# Patient Record
Sex: Female | Born: 1945
Health system: Southern US, Academic
[De-identification: ages and names within clinical notes are randomized; demographics above are authoritative.]

## PROBLEM LIST (undated history)

## (undated) ENCOUNTER — Encounter

## (undated) DIAGNOSIS — T753XXA Motion sickness, initial encounter: Secondary | ICD-10-CM

## (undated) DIAGNOSIS — H9190 Unspecified hearing loss, unspecified ear: Secondary | ICD-10-CM

## (undated) DIAGNOSIS — I10 Essential (primary) hypertension: Secondary | ICD-10-CM

## (undated) DIAGNOSIS — M1990 Unspecified osteoarthritis, unspecified site: Secondary | ICD-10-CM

## (undated) DIAGNOSIS — G43909 Migraine, unspecified, not intractable, without status migrainosus: Secondary | ICD-10-CM

## (undated) DIAGNOSIS — E079 Disorder of thyroid, unspecified: Secondary | ICD-10-CM

## (undated) HISTORY — PX: ABDOMINAL HYSTERECTOMY: SHX81

## (undated) HISTORY — PX: BREAST CYST ASPIRATION: SHX578

## (undated) HISTORY — PX: APPENDECTOMY: SHX54

## (undated) HISTORY — PX: CHOLECYSTECTOMY: SHX55

## (undated) HISTORY — PX: THYROIDECTOMY, PARTIAL: SHX18

---

## 2015-09-20 DIAGNOSIS — A419 Sepsis, unspecified organism: Secondary | ICD-10-CM

## 2015-09-20 DIAGNOSIS — Z23 Encounter for immunization: Secondary | ICD-10-CM

## 2015-09-20 DIAGNOSIS — L03113 Cellulitis of right upper limb: Secondary | ICD-10-CM

## 2015-09-20 DIAGNOSIS — Z91013 Allergy to seafood: Secondary | ICD-10-CM

## 2015-09-20 DIAGNOSIS — Z882 Allergy status to sulfonamides status: Secondary | ICD-10-CM

## 2015-09-20 DIAGNOSIS — S61451A Open bite of right hand, initial encounter: Secondary | ICD-10-CM

## 2015-09-20 DIAGNOSIS — M797 Fibromyalgia: Secondary | ICD-10-CM

## 2015-09-20 DIAGNOSIS — L03119 Cellulitis of unspecified part of limb: Principal | ICD-10-CM

## 2015-09-20 DIAGNOSIS — M1990 Unspecified osteoarthritis, unspecified site: Secondary | ICD-10-CM

## 2015-09-20 DIAGNOSIS — W5501XA Bitten by cat, initial encounter: Secondary | ICD-10-CM

## 2015-09-20 MED ORDER — AMOXICILLIN-POT CLAVULANATE 875-125 MG PO TABS
1 | ORAL_TABLET | Freq: Once | ORAL | Status: CP
Start: 2015-09-20 — End: ?

## 2015-09-20 MED ORDER — RABIES VIRUS VACCINE, HDC IM INJ
1 mL | Freq: Once | INTRAMUSCULAR | Status: CP
Start: 2015-09-20 — End: ?

## 2015-09-20 MED ORDER — TETANUS-DIPHTHERIA TOXOIDS TD 2-2 LF/0.5ML IM SUSP
.5 mL | Freq: Once | INTRAMUSCULAR | Status: CP
Start: 2015-09-20 — End: ?

## 2015-09-20 MED ORDER — RABIES IMMUNE GLOBULIN 150 UNIT/ML IM INJ
20 [IU]/kg | Freq: Once | Status: CP
Start: 2015-09-20 — End: ?

## 2015-09-21 ENCOUNTER — Inpatient Hospital Stay
Admit: 2015-09-21 | Discharge: 2015-09-23 | Disposition: A | Discharge status: Home / Self Care | Admitting: Internal Medicine

## 2015-09-21 ENCOUNTER — Emergency Department: Admit: 2015-09-21 | Discharge: 2015-09-23

## 2015-09-21 ENCOUNTER — Inpatient Hospital Stay: Admit: 2015-09-21 | Discharge: 2015-09-23

## 2015-09-21 DIAGNOSIS — Z882 Allergy status to sulfonamides status: Secondary | ICD-10-CM

## 2015-09-21 DIAGNOSIS — Z23 Encounter for immunization: Secondary | ICD-10-CM

## 2015-09-21 DIAGNOSIS — M797 Fibromyalgia: Principal | ICD-10-CM

## 2015-09-21 DIAGNOSIS — Z91013 Allergy to seafood: Secondary | ICD-10-CM

## 2015-09-21 DIAGNOSIS — L03119 Cellulitis of unspecified part of limb: Principal | ICD-10-CM

## 2015-09-21 DIAGNOSIS — A419 Sepsis, unspecified organism: Secondary | ICD-10-CM

## 2015-09-21 DIAGNOSIS — S61451A Open bite of right hand, initial encounter: Secondary | ICD-10-CM

## 2015-09-21 DIAGNOSIS — M1990 Unspecified osteoarthritis, unspecified site: Secondary | ICD-10-CM

## 2015-09-21 DIAGNOSIS — L03113 Cellulitis of right upper limb: Secondary | ICD-10-CM

## 2015-09-21 MED ORDER — ONDANSETRON 4 MG PO TBDP
4 mg | Freq: Four times a day (QID) | ORAL | Status: DC | PRN
Start: 2015-09-21 — End: 2015-09-23

## 2015-09-21 MED ORDER — METRONIDAZOLE IN NACL 5-0.79 MG/ML-% IV SOLN
500 mg | Freq: Three times a day (TID) | INTRAVENOUS | Status: DC
Start: 2015-09-21 — End: 2015-09-23

## 2015-09-21 MED ORDER — DEXTROSE 50 % IV SOLN
30 mL | INTRAVENOUS | Status: DC | PRN
Start: 2015-09-21 — End: 2015-09-23

## 2015-09-21 MED ORDER — HEPARIN SODIUM (PORCINE) 5000 UNIT/ML IJ SOLN
5000 [IU] | Freq: Two times a day (BID) | SUBCUTANEOUS | Status: DC
Start: 2015-09-21 — End: 2015-09-23

## 2015-09-21 MED ORDER — ONDANSETRON HCL 4 MG/2ML IJ SOLN
4 mg | Freq: Four times a day (QID) | INTRAVENOUS | Status: DC | PRN
Start: 2015-09-21 — End: 2015-09-23

## 2015-09-21 MED ORDER — ACETAMINOPHEN 325 MG PO TABS
650 mg | Freq: Four times a day (QID) | ORAL | Status: DC | PRN
Start: 2015-09-21 — End: 2015-09-23

## 2015-09-21 MED ORDER — HYDRALAZINE HCL 20 MG/ML IJ SOLN
10 mg | Freq: Four times a day (QID) | INTRAVENOUS | Status: DC | PRN
Start: 2015-09-21 — End: 2015-09-23

## 2015-09-21 MED ORDER — SENNOSIDES-DOCUSATE SODIUM 8.6-50 MG PO TABS
1 | ORAL_TABLET | Freq: Two times a day (BID) | ORAL | Status: DC | PRN
Start: 2015-09-21 — End: 2015-09-23

## 2015-09-21 MED ORDER — AMOXICILLIN-POT CLAVULANATE 875-125 MG PO TABS
1 | ORAL_TABLET | Freq: Two times a day (BID) | ORAL | 0 refills | Status: CP
Start: 2015-09-21 — End: ?

## 2015-09-21 MED ORDER — IPRATROPIUM-ALBUTEROL 0.5-2.5 (3) MG/3ML IN SOLN
3 mL | RESPIRATORY_TRACT | Status: DC | PRN
Start: 2015-09-21 — End: 2015-09-23

## 2015-09-21 MED ORDER — MORPHINE SULFATE 2 MG/ML IV SOLN CUSTOM JX
2 mg | Freq: Four times a day (QID) | INTRAVENOUS | Status: DC | PRN
Start: 2015-09-21 — End: 2015-09-23

## 2015-09-21 MED ORDER — OXYCODONE-ACETAMINOPHEN 10-325 MG PO TABS
10 mg | Freq: Four times a day (QID) | ORAL | Status: DC | PRN
Start: 2015-09-21 — End: 2015-09-22

## 2015-09-21 MED ORDER — AMPICILLIN-SULBACTAM 3 G IV MBP
3 g | Freq: Four times a day (QID) | INTRAVENOUS | Status: DC
Start: 2015-09-21 — End: 2015-09-23

## 2015-09-21 MED ORDER — AMOXICILLIN-POT CLAVULANATE 875-125 MG PO TABS
1 | ORAL_TABLET | Freq: Two times a day (BID) | ORAL | Status: DC
Start: 2015-09-21 — End: 2015-09-21

## 2015-09-21 MED ORDER — GLUCOSE 4 G PO CHEW JX
16 g | ORAL | Status: DC | PRN
Start: 2015-09-21 — End: 2015-09-23

## 2015-09-21 MED ORDER — ALUM & MAG HYDROX-SIMETH 1200-1200-120 MG/30ML PO SUSP UD
30 mL | Freq: Four times a day (QID) | ORAL | Status: DC | PRN
Start: 2015-09-21 — End: 2015-09-23

## 2015-09-21 NOTE — Consults
Patient is admitted.

## 2015-09-21 NOTE — ED Notes
Time of discharge: 0104  AM., Patient discharged to  Home.  Patient discharged  ambulatory. to exit with belongings in  Stable condition.  Patient escorted by  family., Written discharge instructions given to  patient.  Patient/recipient  verbalizes discharge instructions.

## 2015-09-21 NOTE — Progress Notes
09/21/15 1500   Readmission Interview   Index DC Date 09/20/15   Readmit Date 09/21/15   How do you think you became sick enough to come back to the hospital? hand bite infection in arm   Did you see your doctor or the doctor's nurse in the office before you came back to the hospital?  No  (Spoke with my physician over the phone, advised me to come )   If no, why not? Spoke with the physician over the phone, he advised me to come to the ED   Describe any difficulties you had in getting a doctor's appointment or getting to that office visit. No   Has anything gotten in the way of your taking your medicines as instructed after your last hospital discharge? No   How do you take your medicines and set up your pills each day? No assistance needed   Additional information? No

## 2015-09-21 NOTE — Plan of Care
Problem: Discharge Planning  Goal: Safe effective discharge  Outcome: Met/Completed Date Met:  09/21/15  CM met with the pt to review discharge planning, pt may need [place] for IV antibiotics upon discharge. CM to follow for discharge planning needs at this time. Pt has transportation home upon discharge.

## 2015-09-21 NOTE — Progress Notes
Seen for admission. Alert and oriented. Respirations are even and unlabored. Denies complaints. No distress noted. Cat bite to right hand with redness extending up right arm to elbow.

## 2015-09-21 NOTE — ED Notes
Pt to rm 4, c/o redness and swelling to right arm after cat bite yesteday. Pt states she had treatment for rabies and tentus yesterday and started on oral antibiotics. AMRN

## 2015-09-21 NOTE — Progress Notes
time. Pt understands by law her HCS is her husband.   Health Care Proxy: Husband Sharnese Heath [phone]  Therapy Recommendations: No PT/OT assessment to determine discharge needs at this time   [place], Durable Medical Equipment: No hx of [place] or DME   Community Resources: Not applicable   Outpatient Case Manager: Not applicable   Primary Care Physician: Dalene Carrow, MD   Pharmacy:   Social History:   Social History     Social History   ? Marital status: Married     Spouse name: N/A   ? Number of children: N/A   ? Years of education: N/A     Occupational History   ? Not on file.     Social History Main Topics   ? Smoking status: Never Smoker   ? Smokeless tobacco: Not on file   ? Alcohol use Not on file   ? Drug use: Not on file   ? Sexual activity: Not on file     Other Topics Concern   ? Not on file     Social History Narrative     Assessment and Discharge Plan:  Additional Information: Met with patient. Introduced self and explained CM role in safe discharge planning. Pt is AOX4 and presents with an approrpriate affect. Pt has no hx of HHC, STR, SNF LTC, ALF, or DME. Pt has access to fill her Rx and transportation home upon discharge.  Pt follows with [facility] for PCP. Discharge planning assessment completed.  Discharge Plan: Home vs Home with [facility], if wound care and IV antibiotics are needed   Discharge Pending: cellulitis of right hand and arm   Expected Discharge Date: Expected discharge date: 09/25/15  Transportation/Contact Person at Discharge: Husband Ashlley Booher [phone]  Manfred Arch   09/21/2015 3:16 PM

## 2015-09-21 NOTE — Progress Notes
09/21/15 1500   Readmission Interview   Index DC Date 09/20/15   Readmit Date 09/21/15   How do you think you became sick enough to come back to the hospital? hand bite infection in arm   Did you see your doctor or the doctor's nurse in the office before you came back to the hospital?  No  (Spoke with my physician over the phone, advised me to come )   If no, why not? Spoke with the physician over the phone, he advised me to come to the ED   Describe any difficulties you had in getting a doctor's appointment or getting to that office visit. No   Has anything gotten in the way of your taking your medicines as instructed after your last hospital discharge? No   How do you take your medicines and set up your pills each day? No assistance needed   Additional information? No      Department of Case Management  Discharge Planning     Patient ID:   NAME:  Katie Haney  MRN: [id number]    AGE: 70 y.o.   DOB: 03/13/1946  Date of Admission: 09/21/2015      Address: [address]  [place] [id number]     Emergency Contact: Husband Barbette OrJames Shifrin [phone]  Payor: Payor: MEDICARE HUMANA / Plan: MEDICARE HUMANA GOLD PLUS HMO / Product Type: HMO/POS /     Medicare Important Message Provided: Needs IM statement  Patient admitted with There is no problem list on file for this patient.      Patient has Capacity for Decision-Making: Yes  Prior Living Arrangements / Resides with: Pt resides with her husband in a single story home.   Patient Capacity for Self-Care / Level of Independence: Pt is independent with her ADLs.   Caregiver / Support System: Husband Barbette OrJames Williams [phone]  Patient provided contact person: Husband Barbette OrJames Debow [phone]    Name: Husband Barbette OrJames Heap [phone]  Relationship: Husband Barbette OrJames Mccaster [phone]  Phone #: Husband Barbette OrJames Reznik [phone]  Advance Directive: Educated on HCS, pt declined to complete them at this

## 2015-09-21 NOTE — ED Notes
Patient arrived to ED, acocmpanied by husband with chief complaint of recent cat bite to lateral right hand. 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 Small punture marks noted to the anterior and posterior portion of her hand. REdness and swelling noted directly on puncture sites but it appears to be radiating down hand. Patient unknown when last Tetanus shot was done. Patient blood pressure slightly elevated, remaining vitals stable, breathing unlabored.

## 2015-09-21 NOTE — ED Notes
Report called to Maya RN. AMRN

## 2015-09-21 NOTE — ED Notes
Rabies Immune Globulin infiltrated into area surrounding punctures by Panfila, RN. Pt tolerated procedure well.

## 2015-09-22 MED ORDER — IBUPROFEN 800 MG PO TABS
800 mg | Freq: Four times a day (QID) | ORAL | Status: DC | PRN
Start: 2015-09-22 — End: 2015-09-23

## 2015-09-22 NOTE — Plan of Care
Problem: Pain, Acute & Chronic  Goal: Pain is relieved/acceptable level with minimal side effects  Outcome: Ongoing  Denied pain or other discomfort will cont to assess.    Problem: Knowledge Deficit  Goal: Understands DX, medications & necessary therapeutic regimen  Outcome: Ongoing  Patient educated on POC, med's, side effects, nursing interventions, verbalize understanding.

## 2015-09-22 NOTE — ED Provider Notes
?   THYROIDECTOMY, PARTIAL         History reviewed. No pertinent family history.    Social History     Social History   ? Marital status: Married     Spouse name: N/A   ? Number of children: N/A   ? Years of education: N/A     Social History Main Topics   ? Smoking status: Never Smoker   ? Smokeless tobacco: None   ? Alcohol use None   ? Drug use: None   ? Sexual activity: Not Asked     Other Topics Concern   ? None     Social History Narrative       Review of Systems   Constitutional: Positive for fever (subjective). Negative for chills.   HENT: Negative for nasal congestion and sore throat.    Respiratory: Negative for cough and shortness of breath.    Cardiovascular: Negative for chest pain.   Gastrointestinal: Negative for nausea, vomiting and abdominal pain.   Genitourinary: Negative for dysuria and frequency.   Musculoskeletal: Negative for joint swelling and arthralgias.   Skin: Positive for wound (cat bite to right arm).   Neurological: Negative for weakness and numbness.   All other systems reviewed and are negative.      Physical Exam       ED Triage Vitals   BP 09/21/15 1308 168/67   Pulse 09/21/15 1308 79   Resp 09/21/15 1308 19   Temp 09/21/15 1308 36.9 ?C (98.4 ?F)   Temp src 09/21/15 1308 Oral   Height 09/21/15 1308 1.585 m   Weight 09/21/15 1308 64.6 kg   SpO2 09/21/15 1308 100 %   BMI (Calculated) 09/21/15 1308 25.77       BP 168/67 - Pulse 79 - Temp 36.9 ?C (98.4 ?F) (Oral)  - Resp 19 - Ht 1.585 m - Wt 64.6 kg - SpO2 100% - BMI 25.71 kg/m2    Physical Exam   Constitutional: She is oriented to person, place, and time. She appears well-developed and well-nourished. No distress.   HENT:   Head: Normocephalic and atraumatic.   Mouth/Throat: Oropharynx is clear and moist.   Eyes: Conjunctivae are normal. Right eye exhibits no discharge. Left eye exhibits no discharge.   Neck: Normal range of motion. Neck supple.   Cardiovascular: Normal rate, regular rhythm, normal heart sounds and

## 2015-09-22 NOTE — ED Provider Notes
Abx vaccine Ig TT updated  Adm for Iv abx  Closing Discussion done with Patient which includes Review of all Results and Explnation of the POA which patient/ parents/ partners have understood      ED Disposition   ED Disposition: Admit      ED Clinical Impression   ED Clinical Impression:   Cellulitis of right upper extremity      ED Patient Status   Patient Status:   Fair        ED Medical Evaluation Initiated   Medical Evaluation Initiated:   Yes, filed at 09/21/15 1303  by Jolene SchimkeGalwankar, Sagar C, MD          Scribe Attestation: I, Jeffie PollockMikel Cress, have acted as a Neurosurgeonscribe for Jolene SchimkeGalwankar, Sagar C, MD 1:34 PM 09/21/2015     Physician Attestation: I have reviewed and confirmed the information stated by the scribe and made corrections and edits as appropriate.  I have personally provided the services documented by the scribe.      Jolene SchimkeSagar C Galwankar, MD        Jolene SchimkeGalwankar, Sagar C, MD  09/21/15 [id number]

## 2015-09-22 NOTE — ED Provider Notes
Worsening Cellulitis RUE with distal N/V Intact  Failed OP Rx : worsening clinically and leucocytosis  Abx vaccine Ig TT updated  Adm for Iv abx  Closing Discussion done with Patient which includes Review of all Results and Explnation of the POA which patient/ parents/ partners have understood      ED Disposition   ED Disposition: Admit      ED Clinical Impression   ED Clinical Impression:   Cellulitis of right upper extremity      ED Patient Status   Patient Status:   Good        ED Medical Evaluation Initiated   Medical Evaluation Initiated:   Yes, filed at 09/21/15 1303  by Jolene SchimkeGalwankar, Sagar C, MD          Scribe Attestation: I, Jeffie PollockMikel Cress, have acted as a Neurosurgeonscribe for Jolene SchimkeGalwankar, Sagar C, MD 1:34 PM 09/21/2015     Physician Attestation: I have reviewed and confirmed the information stated by the scribe and made corrections and edits as appropriate.  I have personally provided the services documented by the scribe.      Jolene SchimkeSagar C Galwankar, MD

## 2015-09-22 NOTE — ED Provider Notes
Imaging (Read by ED Provider):  not applicable    EKG (Read by ED Provider):  not applicable    MDM  Number of Diagnoses or Management Options     Amount and/or Complexity of Data Reviewed  Discussion of test results with the performing providers: no  Decide to obtain previous medical records or to obtain history from someone other than the patient: yes  Obtain history from someone other than the patient: no  Review and summarize past medical records: yes  Discuss the patient with other providers: yes  Independent visualization of images, tracings, or specimens: no        ED Course & Re-Evaluation   70 y.o. female presents with c/o cellulitis to right arm s/p bite from stray cat bite last night. Rabies vaccinations administered and tetanus was updated at that time. Amoxicillin given. However, notes worsening erythema to site upon waking this AM. CBC, BMP, and blood cultures ordered. Plan to admit.  Jolene SchimkeGalwankar, Sagar C, MD 1:34 PM 09/21/2015      ***    ED Disposition   ED Disposition: No ED Disposition Set      ED Clinical Impression   ED Clinical Impression:   No Clinical Impression Set      ED Patient Status   Patient Status:   {SH ED JX PATIENT STATUS:[phone]}        ED Medical Evaluation Initiated   Medical Evaluation Initiated:   Yes, filed at 09/21/15 1303  by Jolene SchimkeGalwankar, Sagar C, MD          Scribe Attestation: I, Jeffie PollockMikel Cress, have acted as a Neurosurgeonscribe for Jolene SchimkeGalwankar, Sagar C, MD 1:34 PM 09/21/2015     Physician Attestation: Marland Kitchen***

## 2015-09-22 NOTE — ED Provider Notes
History     Chief Complaint   Patient presents with   ? Wound Infection       HPI Comments: 70 y.o. female presents with c/o cellulitis to right arm s/p cat bite last night. Patient was seen here in the ED after she was bit by a stray cat. Rabies vaccinations administered and tetanus was updated at that time. Amoxicillin given; d/c home with Rx. However, patient awoke this morning with area of erythema surrounding bite that has been progressively worsening. Denies any discharge from site. Notes subjective fever - no N/V, chills, numbness, weakness, or any other sxs. Patient has no other complaints at this time.      Patient is a 70 y.o. female presenting with Cellulitis. The history is provided by the patient and medical records. No language interpreter was used.   Cellulitis    This is a new problem. The current episode started today. The problem occurs continuously. The problem has been gradually worsening. The abscess is present on the right arm. The problem is moderate. The abscess is characterized by redness and swelling. Associated with: cat bite. The abscess first occurred outside. Associated symptoms include a fever (subjective). Pertinent negatives include no vomiting, no congestion, no sore throat and no cough. Recently, medical care has been given at this facility. Services received include medications given.       Allergies   Allergen Reactions   ? Shellfish Allergy Hives   ? Sulfa Drugs Hives       Current Discharge Medication List      CONTINUE these medications which have NOT CHANGED    Details   amoxicillin-clavulanate (AUGMENTIN) 875-125 MG Tablet Take 1 tablet by mouth 2 times daily for 10 days.  Qty: 20 tablet, Refills: 0    Associated Diagnoses: Cat bite of hand, right, initial encounter             Past Medical History:   Diagnosis Date   ? Arthritis    ? Fibromyalgia        Past Surgical History:   Procedure Laterality Date   ? APPENDECTOMY     ? C-SECTION DELIVERY     ? CHOLECYSTECTOMY

## 2015-09-22 NOTE — ED Provider Notes
RBC 3.80 (*) 4.20 - 5.40 x10E6/uL    Hemoglobin 11.9 (*) 12.0 - 16.0 g/dL    Hematocrit 16.134.4 (*) 37.0 - 47.0 %    RDW 11.9 (*) 12.0 - 16.1 %    Neutrophils % 88.8 (*) 34.0 - 73.0 %    Lymphocytes % 5.5 (*) 25.0 - 45.0 %    Eosinophils % 0.1 (*) 1.0 - 4.0 %    Neutrophils Absolute 17.16 (*) 1.80 - 8.70 x10E3/uL    MCV 90.5  82.0 - 101.0 fl    MCH 31.3  27.0 - 34.0 pg    MCHC 34.6  31.0 - 36.0 g/dL    Platelet Count 096158  140 - 440 thou/cu mm    MPV 11.1  9.5 - 11.5 fl    nRBC % 0.0  0.0 - 1.0 %    Absolute NRBC Count 0.00      Monocytes % 4.7  2.0 - 6.0 %    Immature Granulocytes % 0.7  0.0 - 2.0 %    Lymphocytes Absolute 1.06  x10E3/uL    Monocytes Absolute 0.91  x10E3/uL    Eosinophils Absolute 0.01  x10E3/uL    Basophil Absolute 0.03  x10E3/uL    Basophils % 0.2  0 - 1 %   CULTURE, BLOOD   CULTURE, BLOOD   CBC AND DIFFERENTIAL         Imaging (Read by ED Provider):  not applicable    EKG (Read by ED Provider):  not applicable    MDM  Number of Diagnoses or Management Options     Amount and/or Complexity of Data Reviewed  Discussion of test results with the performing providers: no  Decide to obtain previous medical records or to obtain history from someone other than the patient: yes  Obtain history from someone other than the patient: no  Review and summarize past medical records: yes  Discuss the patient with other providers: yes  Independent visualization of images, tracings, or specimens: no        ED Course & Re-Evaluation   70 y.o. female presents with c/o cellulitis to right arm s/p bite from stray cat bite last night. Rabies vaccinations administered and tetanus was updated at that time. Amoxicillin given. However, notes worsening erythema to site upon waking this AM. CBC, BMP, and blood cultures ordered. Plan to admit.  Jolene SchimkeGalwankar, Sagar C, MD 1:34 PM 09/21/2015      Jolene SchimkeSagar C Galwankar, MD 1:56 PM 09/21/2015  Worsening Cellulitis RUE with distal N/V Intact  Failed OP Rx : worsening clinically and leucocytosis

## 2015-09-22 NOTE — ED Provider Notes
Worsening Cellulitis RUE with distal N/V Intact  Failed OP Rx : worsening clinically and leucocytosis  Abx vaccine Ig TT updated  Adm for Iv abx  Closing Discussion done with Patient which includes Review of all Results and Explnation of the POA which patient/ parents/ partners have understood      ED Disposition   ED Disposition: Admit      ED Clinical Impression   ED Clinical Impression:   Cellulitis of right upper extremity      ED Patient Status   Patient Status:   Fair        ED Medical Evaluation Initiated   Medical Evaluation Initiated:   Yes, filed at 09/21/15 1303  by Jolene SchimkeGalwankar, Sagar C, MD          Scribe Attestation: I, Jeffie PollockMikel Cress, have acted as a Neurosurgeonscribe for Jolene SchimkeGalwankar, Sagar C, MD 1:34 PM 09/21/2015     Physician Attestation: I have reviewed and confirmed the information stated by the scribe and made corrections and edits as appropriate.  I have personally provided the services documented by the scribe.      Jolene SchimkeSagar C Galwankar, MD

## 2015-09-22 NOTE — Progress Notes
Visited with pt. PCP.

## 2015-09-22 NOTE — H&P
Monocytes Absolute 0.91    Eosinophils Absolute 0.01    Basophil Absolute 0.03    Basophils % 0.2   Culture, Blood X 2 - Set 1 of 2    Collection Time: 09/21/15  1:45 PM   Result Value    Culture Result No growth at less than 24 hours incubation    Narrative    Blood culture received with excess volume of blood.  Excess blood culture volumes delay positivity and may lead to false negative results.       Per Radiology: Xr Hand Right 3 Views    Result Date: 09/21/2015  XR HAND RIGHT 3 VIEWS Clinical indication:70 years Female Cat bite Comparison:  None Findings: The osseous structures are intact. No dislocations are visualized. Joint spaces are preserved. No soft tissue calcifications or radiopaque foreign bodies. There is diffuse soft tissue swelling visualized of the medial aspect of the hand adjacent to the fifth metacarpal bone and tracking to the medial aspect of the wrist and adjacent to the ulnar styloid. No evidence of subcutaneous gas.     Impression: Diffuse soft tissue swelling noted of the medial aspect of the hand adjacent to the fifth digit  and tracking to the medial aspect of the wrist without evidence of osseous involvement. These findings can be seen in the setting of infectious/inflammatory process such as cellulitis. I personally reviewed the images and the residents findings and agree with the above. Read By Clarita Crane- Martha Wasserman M.D.  Electronically Verified By - Clarita CraneMartha Wasserman M.D.  Released Date Time - 09/21/2015 12:27 PM  Resident - Micheline RoughMarsela Hyska         Assessment and Plan:     # SIRS/sepsis on admission due to acute cellulitis of right hand extending above elbow after cat bite to her right hand last night. Failed oral antibiotics. Will provide IV Unasyn and flagyl. Will provide adequate pain control.updated tetanus toxoid and rabies. Will closely monitor for compartment syndrome. Will check blood culture if she has fever>100.5 F    # DVT prophylaxis: on s/c heparin    # Code status: Full code

## 2015-09-22 NOTE — ED Provider Notes
Pulmonary/Chest: Effort normal and breath sounds normal. No respiratory distress.   Abdominal: Soft. Bowel sounds are normal. She exhibits no distension. There is no tenderness. There is no rebound and no guarding.   Musculoskeletal: Normal range of motion. She exhibits no tenderness.   Neurological: She is alert and oriented to person, place, and time. No cranial nerve deficit. She exhibits normal muscle tone. Coordination normal.   Skin: Skin is warm and dry. She is not diaphoretic. There is erythema.        Area of erythema to right forearm. Warm to the touch. No tenderness.  No signs of cord compression or compartment syndrome. Distal NV intact.     Psychiatric: She has a normal mood and affect. Her behavior is normal. Judgment and thought content normal.   Nursing note and vitals reviewed.      Differential DDx: animal bite, cellulitis, rabies, other    Is this an Emergent Medical Condition? Yes - Severe Pain/Acute Onset of Symptons  409.901 FS  641.19 FS  627.732 (16) FS    ED Workup   Procedures    Labs:  -   COMPREHENSIVE METABOLIC PANEL - Abnormal        Result Value Ref Range    Chloride 100 (*) 101 - 110 mmol/L    Glucose 118 (*) 71 - 99 mg/dL    Sodium 136  135 - 145 mmol/L    Potassium 4.0  3.3 - 4.6 mmol/L    CO2 23  21 - 29 mmol/L    Urea Nitrogen 12  6 - 22 mg/dL    Creatinine 0.63  0.51 - 0.95 mg/dL    BUN/Creatinine Ratio 19.0  6.0 - 22.0 (calc)    Calcium 8.7  8.6 - 10.0 mg/dL    Total Protein 6.6  6.5 - 8.3 g/dL    Albumin 4.1  3.8 - 4.9 g/dL    Calc Total Globuin 2.5  gm/dL    ALBUMIN/GLOBULIN RATIO 1.6  (calc)    Total Bilirubin 0.3  0.2 - 1.0 mg/dL    Alkaline Phosphatase 69  35 - 104 IU/L    AST 15  14 - 33 IU/L    ALT 12  10 - 42 IU/L    Osmolality Calc 272.8      Anion Gap 13  4 - 16 mmol/L    EGFR >59  mL/min/1.73M2    Comment:   Reference range: =>90 ml/min/1.73M2  eGFR estimates are unable to accurately differentiate levels of GFR above 60 ml/min/1.73M2.   CBC AUTODIFF - Abnormal

## 2015-09-22 NOTE — Progress Notes
Anion Gap 13    EGFR >59   Magnesium    Collection Time: 09/22/15  5:23 AM   Result Value    Magnesium 2.2   Phosphorus    Collection Time: 09/22/15  5:23 AM   Result Value    Phosphorus,Inorganic 2.9   Prealbumin    Collection Time: 09/22/15  5:23 AM   Result Value    Prealbumin 19.0 (L)   CBC with Differential panel result    Collection Time: 09/22/15  5:23 AM   Result Value    WBC 12.74 (H)    RBC 3.74 (L)    Hemoglobin 11.5 (L)    Hematocrit 33.8 (L)    MCV 90.4    MCH 30.7    MCHC 34.0    RDW 12.0    Platelet Count 150    MPV 12.0 (H)    nRBC % 0.0    Absolute NRBC Count 0.00    Neutrophils % 82.5 (H)    Lymphocytes % 10.8 (L)    Monocytes % 6.0    Eosinophils % 0.3 (L)    Immature Granulocytes % 0.2    Neutrophils Absolute 10.50 (H)    Lymphocytes Absolute 1.38    Monocytes Absolute 0.76    Eosinophils Absolute 0.04    Basophil Absolute 0.03    Basophils % 0.2   Per Radiology: Xr Hand Right 3 Views    Result Date: 09/21/2015  XR HAND RIGHT 3 VIEWS Clinical indication:70 years Female Cat bite Comparison:  None Findings: The osseous structures are intact. No dislocations are visualized. Joint spaces are preserved. No soft tissue calcifications or radiopaque foreign bodies. There is diffuse soft tissue swelling visualized of the medial aspect of the hand adjacent to the fifth metacarpal bone and tracking to the medial aspect of the wrist and adjacent to the ulnar styloid. No evidence of subcutaneous gas.     Impression: Diffuse soft tissue swelling noted of the medial aspect of the hand adjacent to the fifth digit  and tracking to the medial aspect of the wrist without evidence of osseous involvement. These findings can be seen in the setting of infectious/inflammatory process such as cellulitis. I personally reviewed the images and the residents findings and agree with the above. Read By Oren Beckmann M.D.  Electronically Verified By - Oren Beckmann M.D.  Released Date Time - 09/21/2015 12:27 PM  Resident -

## 2015-09-22 NOTE — Progress Notes
Department of Hospital Medicine  Progress Note      Admit Date: 09/21/2015   LOS: 1 day     Subjective:     Patient is resting comfortably. Slightly improve pain ,redness and swelling of her right hand and arm. Denies fever/CP/SOB/dysuria/cough/abdominal pain. No other new complaint    Negative all other ROS    Current Hospital Medications:  Scheduled:   ? ampicillin-sulbactam  3 g Intravenous Q6H [facility]   ? heparin  5,000 Units Subcutaneous Q12H [facility]   ? metroNIDAZOLE  500 mg Intravenous Q8H SCH     Continuous Infusions:    PRN:   acetaminophen 650 mg Oral Q6H PRN   aluminum-magnesium-simethicone hydroxide 30 mL Oral Q6H PRN   dextrose 30 mL Intravenous PRN   glucose 16 g Oral PRN   hydrALAZINE 10 mg Intravenous Q6H PRN   ibuprofen 800 mg Oral Q6H PRN   ipratropium-albuterol 3 mL Nebulization Q4H PRN   morphine 2 mg Intravenous Q6H PRN   ondansetron 4 mg Oral Q6H PRN   Or      ondansetron 4 mg Intravenous Q6H PRN   senna-docusate 1 tablet Oral BID PRN       Objective:     Vital Signs: Last Filed Vitals Signs: 24 Hour Range   BP: 123/75 (07/03 1150) BP: (113-131)/(52-75)    Temp: 36.9 ?C (98.4 ?F) (07/03 1150) Temp:  [36.7 ?C (98 ?F)-37.6 ?C (99.7 ?F)]    Pulse: 71 (07/03 1150) Pulse:  [71-82]    Resp: 18 (07/03 1150) Resp:  [16-18]    SpO2: 97 % (07/03 1150) SpO2:  [96 %-100 %]          Pain Score  Pain Rating ([facility] Only) : 0  Pain Assessment  Pain Type: Acute pain  Pain Location: Head  Pain Descriptors: Headache    Date 09/21/15 0700 - 09/22/15 0659 09/22/15 0700 - 09/23/15 0659   Shift 0700-1459 1500-2259 2300-0659 24 Hour Total 0700-1459 1500-2259 2300-0659 24 Hour Total   I  N  T  A  K  E   P.O.     480   480      P.O.     480   480    Shift Total     480   480   O  U  T  P  U  T   Urine              Urine Occurrence     2 x   2 x    Shift Total           NET     480   480       Physical Exam  Constitutional: She is oriented to person, place, and time. She appears well-developed and well-nourished. No distress.

## 2015-09-22 NOTE — ED Provider Notes
intact distal pulses.    Pulmonary/Chest: Effort normal and breath sounds normal. No respiratory distress.   Abdominal: Soft. Bowel sounds are normal. She exhibits no distension. There is no tenderness. There is no rebound and no guarding.   Musculoskeletal: Normal range of motion. She exhibits no tenderness.   Neurological: She is alert and oriented to person, place, and time. No cranial nerve deficit. She exhibits normal muscle tone. Coordination normal.   Skin: Skin is warm and dry. She is not diaphoretic. There is erythema.        Area of erythema to right forearm. Warm to the touch. No tenderness.  No signs of cord compression or compartment syndrome. Distal NV intact.     Psychiatric: She has a normal mood and affect. Her behavior is normal. Judgment and thought content normal.   Nursing note and vitals reviewed.      Differential DDx: animal bite, cellulitis, rabies, other    Is this an Emergent Medical Condition? Yes - Severe Pain/Acute Onset of Symptons  409.901 FS  641.19 FS  627.732 (16) FS    ED Workup   Procedures    Labs:  -   COMPREHENSIVE METABOLIC PANEL - Abnormal        Result Value Ref Range    Chloride 100 (*) 101 - 110 mmol/L    Glucose 118 (*) 71 - 99 mg/dL    Sodium 136  135 - 145 mmol/L    Potassium 4.0  3.3 - 4.6 mmol/L    CO2 23  21 - 29 mmol/L    Urea Nitrogen 12  6 - 22 mg/dL    Creatinine 0.63  0.51 - 0.95 mg/dL    BUN/Creatinine Ratio 19.0  6.0 - 22.0 (calc)    Calcium 8.7  8.6 - 10.0 mg/dL    Total Protein 6.6  6.5 - 8.3 g/dL    Albumin 4.1  3.8 - 4.9 g/dL    Calc Total Globuin 2.5  gm/dL    ALBUMIN/GLOBULIN RATIO 1.6  (calc)    Total Bilirubin 0.3  0.2 - 1.0 mg/dL    Alkaline Phosphatase 69  35 - 104 IU/L    AST 15  14 - 33 IU/L    ALT 12  10 - 42 IU/L    Osmolality Calc 272.8      Anion Gap 13  4 - 16 mmol/L    EGFR >59  mL/min/1.73M2    Comment:   Reference range: =>90 ml/min/1.73M2  eGFR estimates are unable to accurately differentiate levels of GFR above 60 ml/min/1.73M2.

## 2015-09-22 NOTE — ED Provider Notes
ED Disposition   ED Disposition: No ED Disposition Set      ED Clinical Impression   ED Clinical Impression:   No Clinical Impression Set      ED Patient Status   Patient Status:   {SH ED JX PATIENT STATUS:[phone]}        ED Medical Evaluation Initiated   Medical Evaluation Initiated:   Yes, filed at 09/21/15 1303  by Jolene SchimkeGalwankar, Sagar C, MD          Scribe Attestation: I, Jeffie PollockMikel Cress, have acted as a Neurosurgeonscribe for Jolene SchimkeGalwankar, Sagar C, MD 1:34 PM 09/21/2015     Physician Attestation: Marland Kitchen***

## 2015-09-22 NOTE — Plan of Care
Educated pt how to use sonifi. Pt was able to order breakfast.

## 2015-09-22 NOTE — Progress Notes
Katie Haney          Assessment and Plan:     # SIRS/sepsis on admission due to acute cellulitis of right hand extending above elbow after cat bite: slightly improve pain,erythema and swelling today. Continue IV Unasyn and flagyl. Continue adequate pain control. Has updated tetanus toxoid and rabies. Improving leucocytosis. Negative blood culture so far.  ?  # DVT prophylaxis: on s/c heparin  ?  # Code status: Full code    # Disposition: anticipate D/C patient in 2-3 days if stable.   ?  Saw Po  09/22/2015  2:36 PM

## 2015-09-22 NOTE — Plan of Care
Problem: Knowledge Deficit  Goal: Understands DX, medications & necessary therapeutic regimen  Outcome: Ongoing  Educated pt on atx and reason for them. Pt understood with no questions.

## 2015-09-22 NOTE — H&P
Department of Hospital Medicine  Admission H&P      Admission Date and Time: 09/21/2015  1:02 PM     Subjective:     Chief Complaint:  Right hand and arm pain and swelling x 2 days after cat bite    History of Present Illness:  Gala RomneySonja Maser is a 70 y.o. female who is being admitted for evaluation of  right hand and arm pain and swelling after her right hand was bitten by astray cat  last night.  Rabies vaccinations administered and tetanus was updated at that time. Amoxicillin was given and  d/c home with oral antibiotics  However, patient woke up this morning with area of erythema surrounding bite that has been progressively extending above right elbow. .    Past Medical History:   Diagnosis Date   ? Fibromyalgia      Past Surgical History:   Procedure Laterality Date   ? APPENDECTOMY     ? C-SECTION DELIVERY     ? CHOLECYSTECTOMY     ? THYROIDECTOMY, PARTIAL       History reviewed. No pertinent family history. Denies any family h/o of DM, CAD,Stroke or cancer  Social History     Social History   ? Marital status: Married     Spouse name: N/A   ? Number of children: N/A   ? Years of education: N/A     Occupational History   ? Not on file.     Social History Main Topics   ? Smoking status: Never Smoker   ? Smokeless tobacco: Not on file   ? Alcohol use Not on file   ? Drug use: Not on file   ? Sexual activity: Not on file     Other Topics Concern   ? Not on file     Social History Narrative     I have reviewed the past medical, surgical, family and social history.    Home Medications:    (Not in a hospital admission)  Allergies:  Shellfish allergy and Sulfa drugs    Review of Systems   Constitutional: Negative for appetite change, chills and diaphoresis.   HENT: Negative for congestion, dental problem, drooling, hearing loss and mouth sores.    Eyes: Negative for discharge and itching.   Respiratory: Negative for apnea, choking and chest tightness.    Cardiovascular: Negative for chest pain and leg swelling.

## 2015-09-22 NOTE — Consults
Assessment: Patient is set up A with ADLs, limited by decreased FMC. Pt is independent with all mobility. Pt provided with built up foam for increased independence with self feed and HEP for RUE. Pt motivated to participate with therapy with excellent rehab potential. Pt would benefit from continued skilled OT services to maximize I with ADLs and return to PLOF.    Patient with deficits in:  ? ADLs, IADLs, strength, ROM and coordination    Discharge Recommendations:   ? Discharge Disposition: Home with intermittent caregiver supervision/assist and Outpatient  OT    ? DME Recommendations:  1. None    **Note: Discharge recommendations may change based on patient progress. Please refer to the most updated progress note for current discharge recommendations.     Plan of Care: Patient will be seen 4-5 times per week for ADLs, IADLs , therapeutic activities , therapeutic exercise  and patient/caregiver education.    G-Codes:  NA    ________________________  Bryon LionsMaria E Matyjasik-Liggett, OT  09/22/2015

## 2015-09-22 NOTE — Consults
fingers 1/2 composite flexion with full extension    Left Upper Extremity:  ? Within Functional Limits [facility](WFL) for AROM/PROM and strength    Vision History:   ? No significant visual history  Vision:  ? Vision WNL for visual fields, saccades, EOMs and acuity    Neurological Examination of the Upper Extremity:  ? Sensation: Intact  ? Proprioception: Intact   ? Coordination: Decreased FMC R hand, pt is RHD  ? Motor Control: Within Functional Limits  ? Tone: WFL    ADLs:  ? Feeding: Set up assist   ? Grooming: Set up assist  ? UB dressing: Set up assist  ? LB dressing: Set up assist  ? Bathing: Set up assist  ? Toileting: Set up assist    Functional Activities:  Bed Mobility:  ? Supine to Sit: Independence (I) with head of bed (HOB) elevated  ? Sit to Supine: Independence (I) with head of bed (HOB) elevated  Transfers:  ? Sit to/from Stand: Independent with No assistive device    Functional mobility: Pt mobile without AD independently    Balance:   ? Sitting: Static  Independence (I) and Dynamic  Independence (I)  ? Standing: Static  Independence (I) and Dynamic  Independence (I)    Outcome Measures:  ? AM-PAC "6-clicks" Short Form: Raw Score: 18. AM-PAC Score: 41.05. CMS Score: 40.47% - CK    Special Tests:  ? NA    Treatment on Evaluation:  Was additional treatment provided? Yes ADLs: Pt provided with built up foam for utensils for RUE, pt able to grip built up foam and feed self with MOD I, continues to require assist to open containers.  THERAPEUTIC EXERCISE: Pt provided with RUE HEP including AROM wrist and 6 pack hand exercises, copy placed in chart and copy given to pt. Pt demonstrated 5x1 each exercise. Pt also educated on using LUE to assist RUE in achieving full ROM.   Total Coded Minutes: 8    Post Treatment:   Patient Position/safety: Supine, Call Bell within reach, Tray table within reach, All lines and leads intact, Handoff to nurse on patient position

## 2015-09-22 NOTE — ED Provider Notes
CBC AUTODIFF - Abnormal     WBC 19.30 (*) 4.5 - 11 x10E3/uL    RBC 3.80 (*) 4.20 - 5.40 x10E6/uL    Hemoglobin 11.9 (*) 12.0 - 16.0 g/dL    Hematocrit 16.134.4 (*) 37.0 - 47.0 %    RDW 11.9 (*) 12.0 - 16.1 %    Neutrophils % 88.8 (*) 34.0 - 73.0 %    Lymphocytes % 5.5 (*) 25.0 - 45.0 %    Eosinophils % 0.1 (*) 1.0 - 4.0 %    Neutrophils Absolute 17.16 (*) 1.80 - 8.70 x10E3/uL    MCV 90.5  82.0 - 101.0 fl    MCH 31.3  27.0 - 34.0 pg    MCHC 34.6  31.0 - 36.0 g/dL    Platelet Count 096158  140 - 440 thou/cu mm    MPV 11.1  9.5 - 11.5 fl    nRBC % 0.0  0.0 - 1.0 %    Absolute NRBC Count 0.00      Monocytes % 4.7  2.0 - 6.0 %    Immature Granulocytes % 0.7  0.0 - 2.0 %    Lymphocytes Absolute 1.06  x10E3/uL    Monocytes Absolute 0.91  x10E3/uL    Eosinophils Absolute 0.01  x10E3/uL    Basophil Absolute 0.03  x10E3/uL    Basophils % 0.2  0 - 1 %   CULTURE, BLOOD   CULTURE, BLOOD   CBC AND DIFFERENTIAL         Imaging (Read by ED Provider):  not applicable    EKG (Read by ED Provider):  not applicable    MDM  Number of Diagnoses or Management Options     Amount and/or Complexity of Data Reviewed  Discussion of test results with the performing providers: no  Decide to obtain previous medical records or to obtain history from someone other than the patient: yes  Obtain history from someone other than the patient: no  Review and summarize past medical records: yes  Discuss the patient with other providers: yes  Independent visualization of images, tracings, or specimens: no        ED Course & Re-Evaluation   70 y.o. female presents with c/o cellulitis to right arm s/p bite from stray cat bite last night. Rabies vaccinations administered and tetanus was updated at that time. Amoxicillin given. However, notes worsening erythema to site upon waking this AM. CBC, BMP, and blood cultures ordered. Plan to admit.  Jolene SchimkeGalwankar, Sagar C, MD 1:34 PM 09/21/2015      Jolene SchimkeSagar C Galwankar, MD 1:56 PM 09/21/2015

## 2015-09-22 NOTE — ED Provider Notes
intact distal pulses.    Pulmonary/Chest: Effort normal and breath sounds normal. No respiratory distress.   Abdominal: Soft. Bowel sounds are normal. She exhibits no distension. There is no tenderness. There is no rebound and no guarding.   Musculoskeletal: Normal range of motion. She exhibits no tenderness.   Neurological: She is alert and oriented to person, place, and time. No cranial nerve deficit. She exhibits normal muscle tone. Coordination normal.   Skin: Skin is warm and dry. She is not diaphoretic. There is erythema.        Area of erythema to right forearm. Warm to the touch.    Psychiatric: She has a normal mood and affect. Her behavior is normal. Judgment and thought content normal.   Nursing note and vitals reviewed.      Differential DDx: animal bite, cellulitis, rabies, other    Is this an Emergent Medical Condition? Yes - Severe Pain/Acute Onset of Symptons  409.901 FS  641.19 FS  627.732 (16) FS    ED Workup   Procedures    Labs:  -   COMPREHENSIVE METABOLIC PANEL - Abnormal        Result Value Ref Range    Chloride 100 (*) 101 - 110 mmol/L    Glucose 118 (*) 71 - 99 mg/dL    Sodium 136  135 - 145 mmol/L    Potassium 4.0  3.3 - 4.6 mmol/L    CO2 23  21 - 29 mmol/L    Urea Nitrogen 12  6 - 22 mg/dL    Creatinine 0.63  0.51 - 0.95 mg/dL    BUN/Creatinine Ratio 19.0  6.0 - 22.0 (calc)    Calcium 8.7  8.6 - 10.0 mg/dL    Total Protein 6.6  6.5 - 8.3 g/dL    Albumin 4.1  3.8 - 4.9 g/dL    Calc Total Globuin 2.5  gm/dL    ALBUMIN/GLOBULIN RATIO 1.6  (calc)    Total Bilirubin 0.3  0.2 - 1.0 mg/dL    Alkaline Phosphatase 69  35 - 104 IU/L    AST 15  14 - 33 IU/L    ALT 12  10 - 42 IU/L    Osmolality Calc 272.8      Anion Gap 13  4 - 16 mmol/L    EGFR >59  mL/min/1.73M2    Comment:   Reference range: =>90 ml/min/1.73M2  eGFR estimates are unable to accurately differentiate levels of GFR above 60 ml/min/1.73M2.   CBC AUTODIFF - Abnormal     WBC 19.30 (*) 4.5 - 11 x10E3/uL

## 2015-09-22 NOTE — ED Provider Notes
intact distal pulses.    Pulmonary/Chest: Effort normal and breath sounds normal. No respiratory distress.   Abdominal: Soft. Bowel sounds are normal. She exhibits no distension. There is no tenderness. There is no rebound and no guarding.   Musculoskeletal: Normal range of motion. She exhibits no tenderness.   Neurological: She is alert and oriented to person, place, and time. No cranial nerve deficit. She exhibits normal muscle tone. Coordination normal.   Skin: Skin is warm and dry. She is not diaphoretic. There is erythema.        Area of erythema to right forearm. Warm to the touch.    Psychiatric: She has a normal mood and affect. Her behavior is normal. Judgment and thought content normal.   Nursing note and vitals reviewed.      Differential DDx: animal bite, cellulitis, rabies, other    Is this an Emergent Medical Condition? Yes - Severe Pain/Acute Onset of Symptons  409.901 FS  641.19 FS  627.732 (16) FS    ED Workup   Procedures    Labs:  - - No data to display      Imaging (Read by ED Provider):  not applicable    EKG (Read by ED Provider):  not applicable    MDM  Number of Diagnoses or Management Options     Amount and/or Complexity of Data Reviewed  Discussion of test results with the performing providers: no  Decide to obtain previous medical records or to obtain history from someone other than the patient: yes  Obtain history from someone other than the patient: no  Review and summarize past medical records: yes  Discuss the patient with other providers: no  Independent visualization of images, tracings, or specimens: no        ED Course & Re-Evaluation   70 y.o. female presents with c/o cellulitis to right arm s/p bite from stray cat bite last night. Rabies vaccinations administered and tetanus was updated at that time. Amoxicillin given. However, notes worsening erythema to site upon waking this AM. CBC, BMP, and blood cultures ordered. Jolene Schimke***  Galwankar, Sagar C, MD 1:34 PM 09/21/2015      ***

## 2015-09-22 NOTE — H&P
Saw Po  09/21/2015  2:27 PM 2:27 PM

## 2015-09-22 NOTE — ED Provider Notes
History     Chief Complaint   Patient presents with   ? Wound Infection       HPI Comments: 70 y.o. female presents with c/o cellulitis to right arm s/p cat bite last night. Patient was seen here in the ED after she was bit by a stray cat. Rabies vaccinations administered and tetanus was updated at that time. Amoxicillin given; d/c home with Rx. However, patient awoke this morning with area of erythema surrounding bite that has been progressively worsening. Denies any discharge from site. Notes subjective fever - no N/V, chills, numbness, weakness, or any other sxs. Patient has no other complaints at this time.      Patient is a 70 y.o. female presenting with Cellulitis. The history is provided by the patient and medical records. No language interpreter was used.   Cellulitis    This is a new problem. The current episode started today. The problem occurs continuously. The problem has been gradually worsening. The abscess is present on the right arm. The problem is moderate. The abscess is characterized by redness and swelling. Associated with: cat bite. The abscess first occurred outside. Associated symptoms include a fever (subjective). Pertinent negatives include no vomiting, no congestion, no sore throat and no cough. Recently, medical care has been given at this facility. Services received include medications given.       Allergies   Allergen Reactions   ? Shellfish Allergy Hives   ? Sulfa Drugs Hives       Patient's Medications   New Prescriptions    No medications on file   Previous Medications    AMOXICILLIN-CLAVULANATE (AUGMENTIN) 875-125 MG TABLET    Take 1 tablet by mouth 2 times daily for 10 days.   Modified Medications    No medications on file   Discontinued Medications    No medications on file       Past Medical History:   Diagnosis Date   ? Fibromyalgia        Past Surgical History:   Procedure Laterality Date   ? APPENDECTOMY     ? C-SECTION DELIVERY     ? CHOLECYSTECTOMY

## 2015-09-22 NOTE — ED Provider Notes
intact distal pulses.    Pulmonary/Chest: Effort normal and breath sounds normal. No respiratory distress.   Abdominal: Soft. Bowel sounds are normal. She exhibits no distension. There is no tenderness. There is no rebound and no guarding.   Musculoskeletal: Normal range of motion. She exhibits no tenderness.   Neurological: She is alert and oriented to person, place, and time. No cranial nerve deficit. She exhibits normal muscle tone. Coordination normal.   Skin: Skin is warm and dry. She is not diaphoretic. There is erythema.        Area of erythema to right forearm. Warm to the touch.    Psychiatric: She has a normal mood and affect. Her behavior is normal. Judgment and thought content normal.   Nursing note and vitals reviewed.      Differential DDx: animal bite, cellulitis, rabies, other    Is this an Emergent Medical Condition? Yes - Severe Pain/Acute Onset of Symptons  409.901 FS  641.19 FS  627.732 (16) FS    ED Workup   Procedures    Labs:  -   CBC AUTODIFF - Abnormal        Result Value Ref Range    WBC 19.30 (*) 4.5 - 11 x10E3/uL    RBC 3.80 (*) 4.20 - 5.40 x10E6/uL    Hemoglobin 11.9 (*) 12.0 - 16.0 g/dL    Hematocrit 16.134.4 (*) 37.0 - 47.0 %    RDW 11.9 (*) 12.0 - 16.1 %    Neutrophils % 88.8 (*) 34.0 - 73.0 %    Lymphocytes % 5.5 (*) 25.0 - 45.0 %    Eosinophils % 0.1 (*) 1.0 - 4.0 %    Neutrophils Absolute 17.16 (*) 1.80 - 8.70 x10E3/uL    MCV 90.5  82.0 - 101.0 fl    MCH 31.3  27.0 - 34.0 pg    MCHC 34.6  31.0 - 36.0 g/dL    Platelet Count 096158  140 - 440 thou/cu mm    MPV 11.1  9.5 - 11.5 fl    nRBC % 0.0  0.0 - 1.0 %    Absolute NRBC Count 0.00      Monocytes % 4.7  2.0 - 6.0 %    Immature Granulocytes % 0.7  0.0 - 2.0 %    Lymphocytes Absolute 1.06  x10E3/uL    Monocytes Absolute 0.91  x10E3/uL    Eosinophils Absolute 0.01  x10E3/uL    Basophil Absolute 0.03  x10E3/uL    Basophils % 0.2  0 - 1 %   CULTURE, BLOOD   CULTURE, BLOOD   COMPREHENSIVE METABOLIC PANEL   CBC AND DIFFERENTIAL

## 2015-09-22 NOTE — H&P
Gastrointestinal: Negative for abdominal distention, abdominal pain and blood in stool.   Endocrine: Negative for cold intolerance, heat intolerance and polydipsia.   Genitourinary: Negative for difficulty urinating, dyspareunia, dysuria, pelvic pain and urgency.   Musculoskeletal: Negative for arthralgias, back pain and gait problem.   Skin: Positive for rash.   Neurological: Negative for dizziness, facial asymmetry and headaches.   Hematological: Negative for adenopathy.   Psychiatric/Behavioral: Negative for agitation, behavioral problems and confusion.         Objective:     Patient Vitals for the past 8 hrs:   BP Temp Temp src Pulse Resp SpO2 Height Weight   09/21/15 1308 168/67 36.9 ?C (98.4 ?F) Oral 79 19 100 % 1.585 m (5' 2.4") 64.6 kg (142 lb 6.4 oz)     Body mass index is 25.71 kg/(m^2).  Pain Score  Pain Rating ([facility] Only) : 4      Physical Exam   Constitutional: She is oriented to person, place, and time. She appears well-developed and well-nourished. No distress.   HENT:   Head: Normocephalic and atraumatic.   Mouth/Throat: No oropharyngeal exudate.   Eyes: Conjunctivae are normal. Pupils are equal, round, and reactive to light. No scleral icterus.   Neck: Normal range of motion. Neck supple. No tracheal deviation present. No thyromegaly present.   Cardiovascular: Normal rate, regular rhythm and normal heart sounds.  Exam reveals no gallop.    Pulmonary/Chest: Effort normal and breath sounds normal. No respiratory distress. She has no wheezes.   Abdominal: Soft. Bowel sounds are normal. She exhibits no distension. There is no tenderness.   Musculoskeletal: Normal range of motion. She exhibits tenderness. She exhibits no edema.   (+) right hand swelling and erythema with streak of redness extending above right elbow    Neurological: She is alert and oriented to person, place, and time. She displays normal reflexes.   Skin: Skin is warm and dry.

## 2015-09-22 NOTE — Consults
Occupational Therapy Evaluation      Start Time (min): 936  End Time (min): 953  Total Time (min): 17  Room/Bed: A4105/A4105-01    Occupational Profile: Katie Haney is a 70 y.o. female admitted on 09/21/2015 with cat bite to lateral R hand, s/p rabies and tetanus shots, found to have R hand cellulitis.      ICD-10-CM ICD-9-CM   1. Cellulitis of right upper extremity L03.113 682.3     Past Medical History:   Diagnosis Date   ? Arthritis    ? Fibromyalgia      Past Surgical History:   Procedure Laterality Date   ? APPENDECTOMY     ? C-SECTION DELIVERY     ? CHOLECYSTECTOMY     ? THYROIDECTOMY, PARTIAL         Precautions:  ? FALL    Extremity Precautions:  ? None indicated    Orthotic, Protective, & Supportive Devices:  ? None    Living Environment/Function:  ? Lives: alone  Home assistance: Intermittent  ? Lives in: house:  one story  ? Stairs to Enter: 0  ? Home DME: does not use equipment  ? Bathroom Setup: standard toilet, walk-in shower  ? Employment: Patient is retired.  ? Prior Level of Function: was independent with self-care, transfers, ambulation, household tasks and/or recreational activities    Subjective: RN/pt consent to OT eval.  ? Pain: Pt c/o "tenderness" R hand    **Was an interpreter used: NA    Examination:  Observation: Pt supine upon arrival, R hand on ice pack, + IV.    Vitals: Vitals stable    Cognition:   ? Overall Cognitive Functional Status: Patient at baseline level of cognition  ? Arousal/Alertness: appropriate responses to stimuli  ? Orientation: oriented x4  ? Command Following: follows one-step simple commands  ? Safety Awareness/Judgement: good awareness of safety precautions  ? Problem Solving: able to solve independently  ? Sequencing: able to sequence independently  ? Attention Span: appears intact  ? Memory: appears intact  ? Insight: fully aware of deficits    Right Upper Extremity:  ? WFL except wrist and hand, PROM WFL, AROM wrist flex/exten 30 degrees,

## 2015-09-22 NOTE — ED Provider Notes
WBC 19.30 (*) 4.5 - 11 x10E3/uL    RBC 3.80 (*) 4.20 - 5.40 x10E6/uL    Hemoglobin 11.9 (*) 12.0 - 16.0 g/dL    Hematocrit 16.134.4 (*) 37.0 - 47.0 %    RDW 11.9 (*) 12.0 - 16.1 %    Neutrophils % 88.8 (*) 34.0 - 73.0 %    Lymphocytes % 5.5 (*) 25.0 - 45.0 %    Eosinophils % 0.1 (*) 1.0 - 4.0 %    Neutrophils Absolute 17.16 (*) 1.80 - 8.70 x10E3/uL    MCV 90.5  82.0 - 101.0 fl    MCH 31.3  27.0 - 34.0 pg    MCHC 34.6  31.0 - 36.0 g/dL    Platelet Count 096158  140 - 440 thou/cu mm    MPV 11.1  9.5 - 11.5 fl    nRBC % 0.0  0.0 - 1.0 %    Absolute NRBC Count 0.00      Monocytes % 4.7  2.0 - 6.0 %    Immature Granulocytes % 0.7  0.0 - 2.0 %    Lymphocytes Absolute 1.06  x10E3/uL    Monocytes Absolute 0.91  x10E3/uL    Eosinophils Absolute 0.01  x10E3/uL    Basophil Absolute 0.03  x10E3/uL    Basophils % 0.2  0 - 1 %   CBC AND DIFFERENTIAL         Imaging (Read by ED Provider):  not applicable    EKG (Read by ED Provider):  not applicable    MDM  Number of Diagnoses or Management Options     Amount and/or Complexity of Data Reviewed  Discussion of test results with the performing providers: no  Decide to obtain previous medical records or to obtain history from someone other than the patient: yes  Obtain history from someone other than the patient: no  Review and summarize past medical records: yes  Discuss the patient with other providers: yes  Independent visualization of images, tracings, or specimens: no        ED Course & Re-Evaluation   70 y.o. female presents with c/o cellulitis to right arm s/p bite from stray cat bite last night. Rabies vaccinations administered and tetanus was updated at that time. Amoxicillin given. However, notes worsening erythema to site upon waking this AM. CBC, BMP, and blood cultures ordered. Plan to admit.  Jolene SchimkeGalwankar, Sagar C, MD 1:34 PM 09/21/2015      Jolene SchimkeSagar C Galwankar, MD 1:56 PM 09/21/2015  Worsening Cellulitis RUE with distal N/V Intact  Failed OP Rx : worsening clinically and leucocytosis

## 2015-09-22 NOTE — Consults
Physical Therapy Evaluation and Discharge      Start Time (min): 921  End Time (min): 934  Total Time (min): 13  Room/Bed: A4105/A4105-01    History of Present Illness: Katie Haney is a 70 y.o. female admitted on 09/21/2015 with right hand and arm pain/swelling x2 days after cat bite.       ICD-10-CM ICD-9-CM   1. Cellulitis of right upper extremity L03.113 682.3       Precautions:  ? FALL    Extremity Precautions:  ? None indicated    Orthotic, Protective, & Supportive Devices:  ? None    Living Environment/Function:  ? At baseline ambulation and mobility. House, alone, no dme    Subjective: pt and RN consent  ? Pain Pre-Treatment: minimal pain at site, does not quantify  ? Pain Post-Treatment: positioned for comfort    **Was an interpreter used: NA    Objective: Supine with needs, returned to supine with needs    LE ROM/Strength: WNL    Bed Mobility: independent    Transfers: Sit <> Stand: independent    Locomotion:  ? independent    Outcome Measures:  AM-PAC BASIC MOBILITY SCALE SHORT FORM (without stairs): Raw Score: 20. AM-PAC Score: 60.57. CMS Score: 0.00% - CH    Assessment: Pt observed Independent with the following functional mobility: bed mobility, transfers, ambulation with No assistive device.  Will sign off patient case. Pt does not require skilled inpatient acute care PT services.  Recommend home independently at DC. Patient indicates upon DC she soaked her hand in hot water and epson salt due to no one informing her to avoid those modalities    DME Recommendations:  ? None    Plan: D/C PT    ____________________________  Consuella LoseShawn Leftwich, PT  09/22/2015    G-Codes:  G-Code: Mobility    Current Status: CH 0% Impaired     Goal Status: CH 0% Impaired     Discharge Status: CH 0% Impaired

## 2015-09-22 NOTE — ED Provider Notes
?   THYROIDECTOMY, PARTIAL         History reviewed. No pertinent family history.    Social History     Social History   ? Marital status: Married     Spouse name: N/A   ? Number of children: N/A   ? Years of education: N/A     Social History Main Topics   ? Smoking status: Never Smoker   ? Smokeless tobacco: None   ? Alcohol use Yes   ? Drug use: No   ? Sexual activity: Not Asked     Other Topics Concern   ? None     Social History Narrative       Review of Systems   Constitutional: Positive for fever (subjective). Negative for chills.   HENT: Negative for nasal congestion and sore throat.    Respiratory: Negative for cough and shortness of breath.    Cardiovascular: Negative for chest pain.   Gastrointestinal: Negative for nausea, vomiting and abdominal pain.   Genitourinary: Negative for dysuria and frequency.   Musculoskeletal: Negative for joint swelling and arthralgias.   Skin: Positive for wound (cat bite to right arm).   Neurological: Negative for weakness and numbness.   All other systems reviewed and are negative.      Physical Exam       ED Triage Vitals   BP 09/21/15 1308 168/67   Pulse 09/21/15 1308 79   Resp 09/21/15 1308 19   Temp 09/21/15 1308 36.9 ?C (98.4 ?F)   Temp src 09/21/15 1308 Oral   Height 09/21/15 1308 1.585 m   Weight 09/21/15 1308 64.6 kg   SpO2 09/21/15 1308 100 %   BMI (Calculated) 09/21/15 1308 25.77       BP 131/55 - Pulse 82 - Temp 37.6 ?C (99.7 ?F) (Oral)  - Resp 18 - Ht 1.585 m - Wt 64.6 kg - SpO2 97% - BMI 25.71 kg/m2    Physical Exam   Constitutional: She is oriented to person, place, and time. She appears well-developed and well-nourished. No distress.   HENT:   Head: Normocephalic and atraumatic.   Mouth/Throat: Oropharynx is clear and moist.   Eyes: Conjunctivae are normal. Right eye exhibits no discharge. Left eye exhibits no discharge.   Neck: Normal range of motion. Neck supple.   Cardiovascular: Normal rate, regular rhythm, normal heart sounds and intact distal pulses.

## 2015-09-22 NOTE — Progress Notes
HENT:   Head: Normocephalic and atraumatic.   Mouth/Throat: No oropharyngeal exudate.   Eyes: Conjunctivae are normal. Pupils are equal, round, and reactive to light. No scleral icterus.   Neck: Normal range of motion. Neck supple. No tracheal deviation present. No thyromegaly present.   Cardiovascular: Normal rate, regular rhythm and normal heart sounds.  Exam reveals no gallop.    Pulmonary/Chest: Effort normal and breath sounds normal. No respiratory distress. She has no wheezes.   Abdominal: Soft. Bowel sounds are normal. She exhibits no distension. There is no tenderness.   Musculoskeletal: Normal range of motion. Slightly improve right hand and am swelling, tenderness and erythema  Neurological: She is alert and oriented to person, place, and time. She displays normal reflexes.   Skin: Skin is warm and dry.   Psychiatric: She has a normal mood and affect. Her behavior is normal.   ?    Data review:  Results for orders placed or performed during the hospital encounter of 09/21/15 (from the past 24 hour(s))   CBC and Differential    Collection Time: 09/22/15  5:23 AM    Narrative    The following orders were created for panel order CBC and Differential.  Procedure                               Abnormality         Status                     ---------                               -----------         ------                     CBC with Differential pa.Marland Kitchen.Marland Kitchen.[[id number]][[id number]]  Abnormal            Final result                 Please view results for these tests on the individual orders.   Comprehensive Metabolic Panel    Collection Time: 09/22/15  5:23 AM   Result Value    Sodium 139    Potassium 3.7    Chloride 103    CO2 23    Urea Nitrogen 10    Creatinine 0.57    BUN/Creatinine Ratio 17.5    Glucose 115 (H)    Calcium 8.3 (L)    Total Protein 6.4 (L)    Albumin 3.6 (L)    Calc Total Globuin 2.8    ALBUMIN/GLOBULIN RATIO 1.3    Total Bilirubin 0.3    Alkaline Phosphatase 65    AST 15    ALT 12    Osmolality Calc 277.5

## 2015-09-22 NOTE — Progress Notes
09/22/2015 1547 CC spoke with the patient and offered services; patient declined. Patient would like to schedule own apt.  Charlann NossWade Scott

## 2015-09-22 NOTE — H&P
Psychiatric: She has a normal mood and affect. Her behavior is normal.           Data Review:   Results for orders placed or performed during the hospital encounter of 09/21/15 (from the past 24 hour(s))   Comprehensive Metabolic Panel    Collection Time: 09/21/15  1:32 PM   Result Value    Sodium 136    Potassium 4.0    Chloride 100 (L)    CO2 23    Urea Nitrogen 12    Creatinine 0.63    BUN/Creatinine Ratio 19.0    Glucose 118 (H)    Calcium 8.7    Total Protein 6.6    Albumin 4.1    Calc Total Globuin 2.5    ALBUMIN/GLOBULIN RATIO 1.6    Total Bilirubin 0.3    Alkaline Phosphatase 69    AST 15    ALT 12    Osmolality Calc 272.8    Anion Gap 13    EGFR >59   CBC and Differential    Collection Time: 09/21/15  1:33 PM    Narrative    The following orders were created for panel order CBC and Differential.  Procedure                               Abnormality         Status                     ---------                               -----------         ------                     CBC with Differential pa.Marland KitchenMarland Kitchen[[id number]]  Abnormal            Final result                 Please view results for these tests on the individual orders.   Culture, Blood X 2 - Set 1 of 2    Collection Time: 09/21/15  1:33 PM   Result Value    Culture Result No growth at less than 24 hours incubation    Narrative    Blood culture received with excess volume of blood.  Excess blood culture volumes delay positivity and may lead to false negative results.   CBC with Differential panel result    Collection Time: 09/21/15  1:33 PM   Result Value    WBC 19.30 (H)    RBC 3.80 (L)    Hemoglobin 11.9 (L)    Hematocrit 34.4 (L)    MCV 90.5    MCH 31.3    MCHC 34.6    RDW 11.9 (L)    Platelet Count 158    MPV 11.1    nRBC % 0.0    Absolute NRBC Count 0.00    Neutrophils % 88.8 (H)    Lymphocytes % 5.5 (L)    Monocytes % 4.7    Eosinophils % 0.1 (L)    Immature Granulocytes % 0.7    Neutrophils Absolute 17.16 (H)    Lymphocytes Absolute 1.06

## 2015-09-22 NOTE — ED Provider Notes
Abx vaccine Ig TT updated  Adm for Iv abx  Closing Discussion done with Patient which includes Review of all Results and Explnation of the POA which patient/ parents/ partners have understood      ED Disposition   ED Disposition: Admit      ED Clinical Impression   ED Clinical Impression:   Cellulitis of right upper extremity      ED Patient Status   Patient Status:   Good        ED Medical Evaluation Initiated   Medical Evaluation Initiated:   Yes, filed at 09/21/15 1303  by Jolene SchimkeGalwankar, Sagar C, MD          Scribe Attestation: I, Jeffie PollockMikel Cress, have acted as a Neurosurgeonscribe for Jolene SchimkeGalwankar, Sagar C, MD 1:34 PM 09/21/2015     Physician Attestation: I have reviewed and confirmed the information stated by the scribe and made corrections and edits as appropriate.  I have personally provided the services documented by the scribe.      Jolene SchimkeSagar C Galwankar, MD'

## 2015-09-22 NOTE — Progress Notes
Pt seen on proactive rounding.  VSS No complaints of pain, SOB or discomfort.  Pt states no issues or concerns overnight.  Advised pt to call if needed.

## 2015-09-23 MED ORDER — METRONIDAZOLE 500 MG PO TABS
500 mg | Freq: Three times a day (TID) | ORAL | 0 refills | Status: CP
Start: 2015-09-23 — End: ?

## 2015-09-23 NOTE — Progress Notes
Pt seen on proactive rounding.  VSS No complaints of pain, SOB or discomfort.  Pt states no issues or concerns overnight.  Advised pt to call if needed. D/C today

## 2015-09-23 NOTE — Discharge Summary
Department of Hospital Medicine        Gala RomneySonja Haney is a 70 y.o. female patient who is being discharged.    Admit date: 09/21/2015    No discharge date for patient encounter.    Admitting Physician: Bradd CanarySaw Po, MD     Discharge Physician: Bradd CanarySaw Po, M.D    Admission Diagnoses:  SIRS/sepsis on admission due to acute cellulitis of right hand extending above elbow after cat bite    Discharge Diagnoses: same as above    Discharged Condition: stable    Operations / Procedures: IV antibiotics    Hospital Course:   # s/p SIRS/sepsis on admission due to acute cellulitis of right hand extending above elbow after cat bite: resolved  pain,erythema and swelling of her right hand and forearm. On IV Unasyn and flagyl. Will switch to oral augmentin and Flagyl for total 7 days. . Has updated tetanus toxoid and rabies.  Negative blood cultures  ?  # DVT prophylaxis: on s/c heparin  ?  # Code status: Full code      Significant Diagnostic Studies:     Treatments: antibiotics: Unasyn and metronidazole    Disposition:  home    Physical Exam    Constitutional: She is oriented to person, place, and time. She appears well-developed?and well-nourished. No distress.   HENT:   Head: Normocephalic?and atraumatic.   Mouth/Throat: No oropharyngeal exudate.   Eyes: Conjunctivae?are normal. Pupils are equal, round, and reactive to light. No scleral icterus.   Neck: Normal range of motion. Neck supple. No tracheal deviation?present. No thyromegaly?present.   Cardiovascular: Normal rate, regular rhythm?and normal heart sounds. ?Exam reveals no gallop. ?  Pulmonary/Chest: Effort normal?and breath sounds normal. No respiratory distress. She has no wheezes.   Abdominal: Soft. Bowel sounds are normal. She exhibits no distension. There is no tenderness.   Musculoskeletal: Normal range of motion. Resolved right hand and am swelling, tenderness and erythema  Neurological: She is alert?and oriented to person, place, and time. She displays normal reflexes.

## 2015-09-23 NOTE — Progress Notes
EGFR >59   CBC with Differential panel result    Collection Time: 09/23/15  5:18 AM   Result Value    WBC 6.80    RBC 3.40 (L)    Hemoglobin 10.6 (L)    Hematocrit 31.0 (L)    MCV 91.2    MCH 31.2    MCHC 34.2    RDW 12.0    Platelet Count 138 (L)    MPV 11.4    nRBC % 0.0    Absolute NRBC Count 0.00    Neutrophils % 70.3    Lymphocytes % 19.0 (L)    Monocytes % 8.7 (H)    Eosinophils % 1.3    Immature Granulocytes % 0.3    Neutrophils Absolute 4.78    Lymphocytes Absolute 1.29    Monocytes Absolute 0.59    Eosinophils Absolute 0.09    Basophil Absolute 0.03    Basophils % 0.4   Per Radiology: Xr Hand Right 3 Views    Result Date: 09/21/2015  XR HAND RIGHT 3 VIEWS Clinical indication:70 years Female Cat bite Comparison:  None Findings: The osseous structures are intact. No dislocations are visualized. Joint spaces are preserved. No soft tissue calcifications or radiopaque foreign bodies. There is diffuse soft tissue swelling visualized of the medial aspect of the hand adjacent to the fifth metacarpal bone and tracking to the medial aspect of the wrist and adjacent to the ulnar styloid. No evidence of subcutaneous gas.     Impression: Diffuse soft tissue swelling noted of the medial aspect of the hand adjacent to the fifth digit  and tracking to the medial aspect of the wrist without evidence of osseous involvement. These findings can be seen in the setting of infectious/inflammatory process such as cellulitis. I personally reviewed the images and the residents findings and agree with the above. Read By Oren Beckmann M.D.  Electronically Verified By - Oren Beckmann M.D.  Released Date Time - 09/21/2015 12:27 PM  Resident - Kalman Shan Hyska          Assessment and Plan:     # s/p SIRS/sepsis on admission due to acute cellulitis of right hand extending above elbow after cat bite: resolved  pain,erythema and swelling of her right hand and forearm. On IV Unasyn and flagyl. Will switch to

## 2015-09-23 NOTE — Progress Notes
Pt seen on RRT Proactive Rounding.  VSS. No complaints of pain, SOB or discomfort.  Right forearm pink with decreased swelling. Possible dischanrge in the am. No issues noted at this time.  Advised pt to call if needed.

## 2015-09-23 NOTE — Plan of Care
Problem: Pain, Acute & Chronic  Goal: Pain is relieved/acceptable level with minimal side effects  Outcome: Ongoing  Denied pain or other discomfort, will cont to assess.    Problem: Knowledge Deficit  Goal: Understands DX, medications & necessary therapeutic regimen  Patient educated on POC, med's, side effects, nursing interventions, verbalize understanding.

## 2015-09-23 NOTE — Progress Notes
oral augmentin and Flagyl for total 7 days. . Has updated tetanus toxoid and rabies.  Negative blood cultures  ?  # DVT prophylaxis: on s/c heparin  ?  # Code status: Full code    # Disposition:will D/C patient to home today.   ?  Saw Po  09/23/2015  9:47 AM

## 2015-09-23 NOTE — Progress Notes
Head: Normocephalic and atraumatic.   Mouth/Throat: No oropharyngeal exudate.   Eyes: Conjunctivae are normal. Pupils are equal, round, and reactive to light. No scleral icterus.   Neck: Normal range of motion. Neck supple. No tracheal deviation present. No thyromegaly present.   Cardiovascular: Normal rate, regular rhythm and normal heart sounds.  Exam reveals no gallop.    Pulmonary/Chest: Effort normal and breath sounds normal. No respiratory distress. She has no wheezes.   Abdominal: Soft. Bowel sounds are normal. She exhibits no distension. There is no tenderness.   Musculoskeletal: Normal range of motion. Slightly improve right hand and am swelling, tenderness and erythema  Neurological: She is alert and oriented to person, place, and time. She displays normal reflexes.   Skin: Skin is warm and dry.   Psychiatric: She has a normal mood and affect. Her behavior is normal.   ?    Data review:  Results for orders placed or performed during the hospital encounter of 09/21/15 (from the past 24 hour(s))   CBC and Differential    Collection Time: 09/23/15  5:18 AM    Narrative    The following orders were created for panel order CBC and Differential.  Procedure                               Abnormality         Status                     ---------                               -----------         ------                     CBC with Differential pa.Marland Kitchen.Marland Kitchen.[[id number]][[id number]]  Abnormal            Final result                 Please view results for these tests on the individual orders.   Comprehensive Metabolic Panel    Collection Time: 09/23/15  5:18 AM   Result Value    Sodium 138    Potassium 3.6    Chloride 103    CO2 23    Urea Nitrogen 7    Creatinine 0.52    BUN/Creatinine Ratio 13.5    Glucose 97    Calcium 8.3 (L)    Total Protein 6.1 (L)    Albumin 3.5 (L)    Calc Total Globuin 2.6    ALBUMIN/GLOBULIN RATIO 1.3    Total Bilirubin 0.2    Alkaline Phosphatase 60    AST 16    ALT 12    Osmolality Calc 273.6    Anion Gap 12

## 2015-09-23 NOTE — Progress Notes
Head: Normocephalic and atraumatic.   Mouth/Throat: No oropharyngeal exudate.   Eyes: Conjunctivae are normal. Pupils are equal, round, and reactive to light. No scleral icterus.   Neck: Normal range of motion. Neck supple. No tracheal deviation present. No thyromegaly present.   Cardiovascular: Normal rate, regular rhythm and normal heart sounds.  Exam reveals no gallop.    Pulmonary/Chest: Effort normal and breath sounds normal. No respiratory distress. She has no wheezes.   Abdominal: Soft. Bowel sounds are normal. She exhibits no distension. There is no tenderness.   Musculoskeletal: Normal range of motion. Resolved right hand and am swelling, tenderness and erythema  Neurological: She is alert and oriented to person, place, and time. She displays normal reflexes.   Skin: Skin is warm and dry.   Psychiatric: She has a normal mood and affect. Her behavior is normal.   ?    Data review:  Results for orders placed or performed during the hospital encounter of 09/21/15 (from the past 24 hour(s))   CBC and Differential    Collection Time: 09/23/15  5:18 AM    Narrative    The following orders were created for panel order CBC and Differential.  Procedure                               Abnormality         Status                     ---------                               -----------         ------                     CBC with Differential pa.Marland Kitchen.Marland Kitchen.[[id number]][[id number]]  Abnormal            Final result                 Please view results for these tests on the individual orders.   Comprehensive Metabolic Panel    Collection Time: 09/23/15  5:18 AM   Result Value    Sodium 138    Potassium 3.6    Chloride 103    CO2 23    Urea Nitrogen 7    Creatinine 0.52    BUN/Creatinine Ratio 13.5    Glucose 97    Calcium 8.3 (L)    Total Protein 6.1 (L)    Albumin 3.5 (L)    Calc Total Globuin 2.6    ALBUMIN/GLOBULIN RATIO 1.3    Total Bilirubin 0.2    Alkaline Phosphatase 60    AST 16    ALT 12    Osmolality Calc 273.6    Anion Gap 12

## 2015-09-23 NOTE — Discharge Summary
Skin: Skin is warm?and dry.   Psychiatric: She has a normal mood and affect. Her behavior is normal.   ??     Discharge Medications      Notice     You have not been prescribed any medications.          No discharge procedures on file.     Saw Po  09/23/2015 9:47 AM

## 2015-09-23 NOTE — Progress Notes
Pt is being discharged.  Educated pt on discharge instructions and medications.  RX given to pt.  Removed INT and pressure dressing applied.  Pt will leave via car to home with husband.  NAD at the time of discharge.

## 2015-09-23 NOTE — Plan of Care
Problem: Impaired Skin Integrity, Risk for  Goal: Maintains integrity of mucous membranes, skin and tissues  Outcome: Ongoing  Pts right arm still has redness and is warm to the touch. Swelling has gone down significantly. Provided pt with ice pack for arm. Will continue to monitor.

## 2015-09-23 NOTE — Progress Notes
Department of Hospital Medicine  Progress Note      Admit Date: 09/21/2015   LOS: 2 days     Subjective:     Patient is doing well. No acute distress. Resolved pain ,redness and swelling of her right hand and forearm. Denies fever/CP/SOB/dysuria/cough/abdominal pain. No other new complaint    Negative all other ROS    Current Hospital Medications:  Scheduled:   ? ampicillin-sulbactam  3 g Intravenous Q6H [facility]   ? heparin  5,000 [facility]   ? metroNIDAZOLE  500 mg Intravenous Q8H SCH     Continuous Infusions:    PRN:     acetaminophen 650 mg Oral Q6H PRN   aluminum-magnesium-simethicone hydroxide 30 mL Oral Q6H PRN   dextrose 30 mL Intravenous PRN   glucose 16 g Oral PRN   hydrALAZINE 10 mg Intravenous Q6H PRN   ibuprofen 800 mg Oral Q6H PRN   ipratropium-albuterol 3 mL Nebulization Q4H PRN   morphine 2 mg Intravenous Q6H PRN   ondansetron 4 mg Oral Q6H PRN   Or      ondansetron 4 mg Intravenous Q6H PRN   senna-docusate 1 tablet Oral BID PRN       Objective:     Vital Signs: Last Filed Vitals Signs: 24 Hour Range   BP: 142/79 (07/04 0800) BP: (119-155)/(58-79)    Temp: 37.3 ?C (99.2 ?F) (07/04 0800) Temp:  [36.9 ?C (98.4 ?F)-37.3 ?C (99.2 ?F)]    Pulse: 70 (07/04 0800) Pulse:  [67-76]    Resp: 20 (07/04 0800) Resp:  [16-20]    SpO2: 96 % (07/04 0358) SpO2:  [96 %-98 %]          Pain Score  Pain Rating ([facility] Only) : 0  Score: FLACC : 0       Date 09/22/15 0700 - 09/23/15 0659 09/23/15 0700 - 09/24/15 0659   Shift 0700-1459 1500-2259 2300-0659 24 Hour Total 0700-1459 1500-2259 2300-0659 24 Hour Total   I  N  T  A  K  E   P.O. 480 240  720          P.O. 480 240  720        Shift Total 480 240  720       O  U  T  P  U  T   Urine 1 1  2           Urine 1 1  2           Urine Occurrence 2 x   2 x        Shift Total 1 1  2        NET 479 239  718           Physical Exam  Constitutional: She is oriented to person, place, and time. She appears well-developed and well-nourished. No distress.   HENT:

## 2015-09-25 NOTE — ED Provider Notes
History   No chief complaint on file.      HPI Comments: 70 y.o. female presents with c/o cat bite at estate sell earlier today. Stray cat approached pt while outside,  at estate sell. Pt bent down to pet cat, and cat bit pt on R hand. Pt reports streaking from bite area, and throbbing pain. Pt has soaked hand in epson saltwater.  At this time, pt has no other concerns.    Hyparthemar emenous swelling and erythema on R hand. Puncture on palmar none on dorsal surface.     Cellulitis, abscess, rabies, other      Patient is a 70 y.o. female presenting with animal bite. The history is provided by the patient. No language interpreter was used.   Animal Bite   Contact animal:  Cat  Location:  Hand  Hand injury location:  R hand  Pain details:     Quality:  Aching    Severity:  Mild    Timing:  Constant    Progression:  Worsening  Incident location:  Outside  Provoked: unprovoked    Animal's rabies vaccination status:  Not immunized  Animal in possession: no    Tetanus status:  Unknown  Relieved by:  Nothing  Ineffective treatments: soaking in epsom salt.  Associated symptoms: no fever and no numbness        Allergies not on file    Patient's Medications    No medications on file       No past medical history on file.    No past surgical history on file.    No family history on file.    Social History     Social History   ? Marital status: N/A     Spouse name: N/A   ? Number of children: N/A   ? Years of education: N/A     Social History Main Topics   ? Smoking status: Not on file   ? Smokeless tobacco: Not on file   ? Alcohol use Not on file   ? Drug use: Not on file   ? Sexual activity: Not on file     Other Topics Concern   ? Not on file     Social History Narrative       Review of Systems   Constitutional: Negative for fever.   Neurological: Negative for numbness.       Physical Exam     ED Triage Vitals   BP --    Pulse --    Resp --    Temp --    Temp src --    Height --    Weight --    SpO2 --

## 2015-09-25 NOTE — ED Provider Notes
70 y.o. female presents with c/o cat bite at estate sell earlier today. Stray cat approached pt while outside. Pt bent down to pet cat which resulted in cat bitting pt on R hand. Movax, augmentin, tetanus, and hyperRAB administered. XR ordered. Will reassess.   Sherri SearParsons, Melissa E, MD 10:58 PM 09/20/2015         ED Disposition   ED Disposition: Discharge      ED Clinical Impression   ED Clinical Impression:   Cat bite of hand, right, initial encounter  Cellulitis of right upper extremity      ED Patient Status   Patient Status:   Good        ED Medical Evaluation Initiated   Medical Evaluation Initiated:   Yes, filed at 09/20/15 2250  by Sherri SearParsons, Melissa E, MD        Scribe Attestation: I, Teodoro SprayElisabeth Adkins, have acted as a Neurosurgeonscribe for Sherri SearParsons, Melissa E, MD 10:58 PM 09/20/2015     Physician Attestation: Marland Kitchen***

## 2015-09-25 NOTE — ED Provider Notes
History     Chief Complaint   Patient presents with   ? Animal Bite     Right Hand (Lateral)       HPI Comments: 70 y.o. female presents with c/o cat bite at estate sell earlier today. Stray cat approached pt while outside. Pt bent down to pet cat which resulted in cat bitting pt on R hand. Pt reports that cat has bitten other people several times. Pt believes cat is not likely to have rabies. Pain is described as throbbing that radiates down R forearm, accompanied with red streaks from puncture site. Pt soaked hand in epsom salt and elevated hand to alleviate pain.  At this time, pt has no other concerns.          Patient is a 70 y.o. female presenting with animal bite. The history is provided by the patient. No language interpreter was used.   Animal Bite   Contact animal:  Cat  Location:  Hand  Hand injury location:  R hand  Pain details:     Quality:  Aching    Severity:  Mild    Timing:  Constant    Progression:  Worsening  Incident location:  Outside  Provoked: unprovoked    Animal's rabies vaccination status:  Not immunized  Animal in possession: no    Tetanus status:  Unknown  Relieved by:  Nothing  Ineffective treatments: soaking in epsom salt.  Associated symptoms: no fever and no numbness        Allergies   Allergen Reactions   ? Shellfish Allergy Hives   ? Sulfa Drugs Hives       Patient's Medications    No medications on file       Past Medical History:   Diagnosis Date   ? Fibromyalgia        Past Surgical History:   Procedure Laterality Date   ? APPENDECTOMY     ? C-SECTION DELIVERY     ? CHOLECYSTECTOMY     ? THYROIDECTOMY, PARTIAL         History reviewed. No pertinent family history.    Social History     Social History   ? Marital status: N/A     Spouse name: N/A   ? Number of children: N/A   ? Years of education: N/A     Social History Main Topics   ? Smoking status: Never Smoker   ? Smokeless tobacco: None   ? Alcohol use None   ? Drug use: None   ? Sexual activity: Not Asked

## 2015-09-25 NOTE — ED Provider Notes
History   No chief complaint on file.      HPI Comments: 70 y.o. female presents with c/o cat bite at estate sell earlier today. Stray cat came up her to at estate sell. Pt bent down to pet cat, and cat bit pt on R hand. Pt reports streaking from bite area, and throbbing pain. Pt has soaked hand in epson saltwater.  At this time, pt has no other concerns.    Hyparthemar emenous swelling and erythema on R hand. Puncture on palmar none on dorsal surface.     Cellulitis, abscess, rabies, other      Patient is a 70 y.o. female presenting with animal bite. The history is provided by the patient. No language interpreter was used.   Animal Bite       Allergies not on file    Patient's Medications    No medications on file       No past medical history on file.    No past surgical history on file.    No family history on file.    Social History     Social History   ? Marital status: N/A     Spouse name: N/A   ? Number of children: N/A   ? Years of education: N/A     Social History Main Topics   ? Smoking status: Not on file   ? Smokeless tobacco: Not on file   ? Alcohol use Not on file   ? Drug use: Not on file   ? Sexual activity: Not on file     Other Topics Concern   ? Not on file     Social History Narrative       Review of Systems    Physical Exam     ED Triage Vitals   BP --    Pulse --    Resp --    Temp --    Temp src --    Height --    Weight --    SpO2 --    BMI (Calculated) --        There were no vitals taken for this visit.    Physical Exam    Differential DDx: ***    Is this an Emergent Medical Condition? {SH ED EMERGENT MEDICAL [phone]}  409.901 FS  641.19 FS  627.732 (16) FS    ED Workup   Procedures    Labs:  - - No data to display      Imaging (Read by ED Provider):  {Imaging [phone]}    EKG (Read by ED Provider):  {EKG findings:[phone]}    MDM    ED Course & Re-Evaluation     Sherri SearParsons, Melissa E, MD 10:58 PM 09/20/2015         ED Disposition

## 2015-09-25 NOTE — ED Provider Notes
Is this an Emergent Medical Condition? Yes - Severe Pain/Acute Onset of Symptons  409.901 FS  641.19 FS  627.732 (16) FS    ED Workup   Procedures    Labs:  - - No data to display      Imaging (Read by ED Provider):  ***    EKG (Read by ED Provider):  not applicable    MDM  Number of Diagnoses or Management Options     Amount and/or Complexity of Data Reviewed  Tests in the radiology section of CPT?: ordered and reviewed  Discussion of test results with the performing providers: no  Decide to obtain previous medical records or to obtain history from someone other than the patient: no  Obtain history from someone other than the patient: no  Review and summarize past medical records: no  Discuss the patient with other providers: no  Independent visualization of images, tracings, or specimens: no        ED Course & Re-Evaluation   70 y.o. female presents with c/o cat bite at estate sell earlier today. Stray cat approached pt while outside. Pt bent down to pet cat which resulted in cat bitting pt on R hand. Movax, augmentin, tetanus, and hyperRAB administered. XR ordered. Will reassess.   Sherri SearParsons, Melissa E, MD 10:58 PM 09/20/2015         ED Disposition   ED Disposition: No ED Disposition Set      ED Clinical Impression   ED Clinical Impression:   No Clinical Impression Set      ED Patient Status   Patient Status:   Good        ED Medical Evaluation Initiated   Medical Evaluation Initiated:   Yes, filed at 09/20/15 2250  by Sherri SearParsons, Melissa E, MD        Scribe Attestation: I, Teodoro SprayElisabeth Adkins, have acted as a Neurosurgeonscribe for Sherri SearParsons, Melissa E, MD 10:58 PM 09/20/2015     Physician Attestation: Marland Kitchen***

## 2015-09-25 NOTE — ED Provider Notes
?   Marital status: Married     Spouse name: N/A   ? Number of children: N/A   ? Years of education: N/A     Social History Main Topics   ? Smoking status: Never Smoker   ? Smokeless tobacco: None   ? Alcohol use None   ? Drug use: None   ? Sexual activity: Not Asked     Other Topics Concern   ? None     Social History Narrative   ? None       Review of Systems   Constitutional: Negative for fever and activity change.   Gastrointestinal: Negative for nausea and vomiting.   Musculoskeletal: Negative for arthralgias.   Skin: Positive for color change and wound.   Neurological: Negative for weakness and numbness.   All other systems reviewed and are negative.      Physical Exam       ED Triage Vitals   BP 09/20/15 2256 177/89   Pulse 09/20/15 2256 80   Resp 09/20/15 2256 16   Temp 09/20/15 2256 36.7 ?C (98 ?F)   Temp src 09/20/15 2256 Oral   Height 09/20/15 2256 1.575 m   Weight 09/20/15 2256 65.6 kg   SpO2 09/20/15 2256 99 %   BMI (Calculated) 09/20/15 2256 26.5       BP 177/89 - Pulse 80 - Temp 36.7 ?C (98 ?F) (Oral)  - Resp 16 - Ht 1.575 m - Wt 65.6 kg - SpO2 99% - BMI 26.45 kg/m2    Physical Exam   Constitutional: She is oriented to person, place, and time. She appears well-developed and well-nourished. No distress.   HENT:   Head: Normocephalic and atraumatic.   Right Ear: External ear normal.   Eyes: EOM are normal. Pupils are equal, round, and reactive to light.   Neck: Normal range of motion. No JVD present. No tracheal deviation present.   Cardiovascular: Normal rate and intact distal pulses.    Pulmonary/Chest: Effort normal and breath sounds normal. No respiratory distress.   Musculoskeletal: Normal range of motion. She exhibits no edema.   Neurological: She is alert and oriented to person, place, and time.   Skin: Skin is warm and dry. She is not diaphoretic.   Swelling of hypothenar eminence and erythema of R hand. Observed puncture on palmar aspect of hand with no observed puncture on dorsal aspect. Nml

## 2015-09-25 NOTE — ED Provider Notes
ROM. Sensation intact. Neurovascularly intact.    Psychiatric: She has a normal mood and affect. Her behavior is normal.   Nursing note and vitals reviewed.      Differential DDx: Cellulitis, abscess, rabies, other      Is this an Emergent Medical Condition? Yes - Severe Pain/Acute Onset of Symptons  409.901 FS  641.19 FS  627.732 (16) FS    ED Workup   Procedures    Labs:  - - No data to display      Imaging (Read by ED Provider):  Per Radiology: Xr Hand Right 3 Views    Result Date: 09/20/2015  XR HAND RIGHT 3 VIEWS Clinical indication:70 years Female Cat bite Comparison:  None Findings: The osseous structures are intact. No dislocations are visualized. Joint spaces are preserved. No soft tissue calcifications or radiopaque foreign bodies. There is diffuse soft tissue swelling visualized of the medial aspect of the hand adjacent to the fifth metacarpal bone and tracking to the medial aspect of the wrist and adjacent to the ulnar styloid. No evidence of subcutaneous gas.     Impression: Diffuse soft tissue swelling noted of the medial aspect of the hand adjacent to the fifth digit  and tracking to the medial aspect of the wrist without evidence of osseous involvement. These findings can be seen in the setting of infectious/inflammatory process such as cellulitis.       EKG (Read by ED Provider):  not applicable    MDM  Number of Diagnoses or Management Options     Amount and/or Complexity of Data Reviewed  Tests in the radiology section of CPT?: ordered and reviewed  Discussion of test results with the performing providers: no  Decide to obtain previous medical records or to obtain history from someone other than the patient: no  Obtain history from someone other than the patient: no  Review and summarize past medical records: no  Discuss the patient with other providers: no  Independent visualization of images, tracings, or specimens: no        ED Course & Re-Evaluation

## 2015-09-25 NOTE — ED Provider Notes
History     Chief Complaint   Patient presents with   ? Animal Bite     Right Hand (Lateral)       HPI Comments: 70 y.o. female presents with c/o cat bite at estate sell earlier today. Stray cat approached pt while outside. Pt bent down to pet cat which resulted in cat bitting pt on R hand. Pt reports that cat has bitten other people several times. Pt believes cat is not likely to have rabies. Pain is described as throbbing that radiates down R forearm, accompanied with red streaks from puncture site. Pt soaked hand in epsom salt and elevated hand to alleviate pain.  At this time, pt has no other concerns.          Patient is a 70 y.o. female presenting with animal bite. The history is provided by the patient. No language interpreter was used.   Animal Bite   Contact animal:  Cat  Location:  Hand  Hand injury location:  R hand  Pain details:     Quality:  Aching    Severity:  Mild    Timing:  Constant    Progression:  Worsening  Incident location:  Outside  Provoked: unprovoked    Animal's rabies vaccination status:  Not immunized  Animal in possession: no    Tetanus status:  Unknown  Relieved by:  Nothing  Ineffective treatments: soaking in epsom salt.  Associated symptoms: no fever and no numbness        Allergies   Allergen Reactions   ? Shellfish Allergy Hives   ? Sulfa Drugs Hives       Patient's Medications   New Prescriptions    AMOXICILLIN-CLAVULANATE (AUGMENTIN) 875-125 MG TABLET    Take 1 tablet by mouth 2 times daily for 10 days.   Previous Medications    No medications on file   Modified Medications    No medications on file   Discontinued Medications    No medications on file       Past Medical History:   Diagnosis Date   ? Fibromyalgia        Past Surgical History:   Procedure Laterality Date   ? APPENDECTOMY     ? C-SECTION DELIVERY     ? CHOLECYSTECTOMY     ? THYROIDECTOMY, PARTIAL         History reviewed. No pertinent family history.    Social History     Social History

## 2015-09-25 NOTE — ED Provider Notes
History   No chief complaint on file.      HPI    Allergies not on file    Patient's Medications    No medications on file       No past medical history on file.    No past surgical history on file.    No family history on file.    Social History     Social History   ? Marital status: N/A     Spouse name: N/A   ? Number of children: N/A   ? Years of education: N/A     Social History Main Topics   ? Smoking status: Not on file   ? Smokeless tobacco: Not on file   ? Alcohol use Not on file   ? Drug use: Not on file   ? Sexual activity: Not on file     Other Topics Concern   ? Not on file     Social History Narrative       Review of Systems    Physical Exam     ED Triage Vitals   BP --    Pulse --    Resp --    Temp --    Temp src --    Height --    Weight --    SpO2 --    BMI (Calculated) --        There were no vitals taken for this visit.    Physical Exam    Differential DDx: ***    Is this an Emergent Medical Condition? {SH ED EMERGENT MEDICAL CONDITION:[id number]}  409.901 FS  641.19 FS  627.732 (16) FS    ED Workup   Procedures    Labs:  - - No data to display      Imaging (Read by ED Provider):  {Imaging findings:[phone]}    EKG (Read by ED Provider):  {EKG [phone]}    MDM    ED Course & Re-Evaluation     ED Course         ED Disposition   ED Disposition: No ED Disposition Set      ED Clinical Impression   ED Clinical Impression:   No Clinical Impression Set      ED Patient Status   Patient Status:   {[phone]}        ED Medical Evaluation Initiated   Medical Evaluation Initiated:   Yes, filed at 09/20/15 2250  by Sherri SearParsons, Melissa E, MD

## 2015-09-25 NOTE — ED Provider Notes
Other Topics Concern   ? None     Social History Narrative   ? None       Review of Systems   Constitutional: Negative for fever and activity change.   Gastrointestinal: Negative for nausea and vomiting.   Musculoskeletal: Negative for arthralgias.   Skin: Positive for color change and wound.   Neurological: Negative for weakness and numbness.   All other systems reviewed and are negative.      Physical Exam       ED Triage Vitals   BP 09/20/15 2256 177/89   Pulse 09/20/15 2256 80   Resp 09/20/15 2256 16   Temp 09/20/15 2256 36.7 ?C (98 ?F)   Temp src 09/20/15 2256 Oral   Height 09/20/15 2256 1.575 m   Weight 09/20/15 2256 65.6 kg   SpO2 09/20/15 2256 99 %   BMI (Calculated) 09/20/15 2256 26.5       BP 177/89 - Pulse 80 - Temp 36.7 ?C (98 ?F) (Oral)  - Resp 16 - Ht 1.575 m - Wt 65.6 kg - SpO2 99% - BMI 26.45 kg/m2    Physical Exam   Constitutional: She is oriented to person, place, and time. She appears well-developed and well-nourished. No distress.   HENT:   Head: Normocephalic and atraumatic.   Right Ear: External ear normal.   Eyes: EOM are normal. Pupils are equal, round, and reactive to light.   Neck: Normal range of motion. No JVD present. No tracheal deviation present.   Cardiovascular: Normal rate and intact distal pulses.    Pulmonary/Chest: Effort normal and breath sounds normal. No respiratory distress.   Musculoskeletal: Normal range of motion. She exhibits no edema.   Neurological: She is alert and oriented to person, place, and time.   Skin: Skin is warm and dry. She is not diaphoretic.   Swelling of hypothenar eminence and erythema of R hand. Observed puncture on palmar aspect of hand with no observed puncture on dorsal aspect. Nml ROM. Sensation intact. Neurovascularly intact.    Psychiatric: She has a normal mood and affect. Her behavior is normal.   Nursing note and vitals reviewed.      Differential DDx: Cellulitis, abscess, rabies, other

## 2015-09-25 NOTE — ED Provider Notes
ED Disposition: No ED Disposition Set      ED Clinical Impression   ED Clinical Impression:   No Clinical Impression Set      ED Patient Status   Patient Status:   Good        ED Medical Evaluation Initiated   Medical Evaluation Initiated:   Yes, filed at 09/20/15 2250  by Sherri SearParsons, Melissa E, MD        Scribe Attestation: I, Teodoro SprayElisabeth Adkins, have acted as a Neurosurgeonscribe for Sherri SearParsons, Melissa E, MD 10:58 PM 09/20/2015     Physician Attestation: Marland Kitchen***

## 2015-09-25 NOTE — ED Provider Notes
BMI (Calculated) --        There were no vitals taken for this visit.    Physical Exam    Differential DDx: ***    Is this an Emergent Medical Condition? {SH ED EMERGENT MEDICAL CONDITION:[phone]}  409.901 FS  641.19 FS  627.732 (16) FS    ED Workup   Procedures    Labs:  - - No data to display      Imaging (Read by ED Provider):  {Imaging [phone]}    EKG (Read by ED Provider):  {EKG findings:[phone]}    MDM    ED Course & Re-Evaluation     Sherri SearParsons, Melissa E, MD 10:58 PM 09/20/2015         ED Disposition   ED Disposition: No ED Disposition Set      ED Clinical Impression   ED Clinical Impression:   No Clinical Impression Set      ED Patient Status   Patient Status:   Good        ED Medical Evaluation Initiated   Medical Evaluation Initiated:   Yes, filed at 09/20/15 2250  by Sherri SearParsons, Melissa E, MD        Scribe Attestation: I, Teodoro SprayElisabeth Adkins, have acted as a Neurosurgeonscribe for Sherri SearParsons, Melissa E, MD 10:58 PM 09/20/2015     Physician Attestation: Marland Kitchen***

## 2015-09-25 NOTE — ED Provider Notes
History   No chief complaint on file.      Patient is a 70 y.o. female presenting with animal bite. The history is provided by the patient. No language interpreter was used.   Animal Bite       Allergies not on file    Patient's Medications    No medications on file       No past medical history on file.    No past surgical history on file.    No family history on file.    Social History     Social History   ? Marital status: N/A     Spouse name: N/A   ? Number of children: N/A   ? Years of education: N/A     Social History Main Topics   ? Smoking status: Not on file   ? Smokeless tobacco: Not on file   ? Alcohol use Not on file   ? Drug use: Not on file   ? Sexual activity: Not on file     Other Topics Concern   ? Not on file     Social History Narrative       Review of Systems    Physical Exam     ED Triage Vitals   BP --    Pulse --    Resp --    Temp --    Temp src --    Height --    Weight --    SpO2 --    BMI (Calculated) --        There were no vitals taken for this visit.    Physical Exam    Differential DDx: ***    Is this an Emergent Medical Condition? {SH ED EMERGENT MEDICAL CONDITION:[phone]}  409.901 FS  641.19 FS  627.732 (16) FS    ED Workup   Procedures    Labs:  - - No data to display      Imaging (Read by ED Provider):  {Imaging findings:[phone]}    EKG (Read by ED Provider):  {EKG findings:[phone]}    MDM    ED Course & Re-Evaluation     Sherri SearParsons, Melissa E, MD 10:58 PM 09/20/2015         ED Disposition   ED Disposition: No ED Disposition Set      ED Clinical Impression   ED Clinical Impression:   No Clinical Impression Set      ED Patient Status   Patient Status:   {[facility][phone]}        ED Medical Evaluation Initiated   Medical Evaluation Initiated:   Yes, filed at 09/20/15 2250  by Sherri SearParsons, Melissa E, MD        Scribe Attestation: I, Teodoro SprayElisabeth Adkins, have acted as a Neurosurgeonscribe for Sherri SearParsons, Melissa E, MD 10:58 PM 09/20/2015     Physician Attestation: Marland Kitchen***

## 2015-09-25 NOTE — ED Provider Notes
70 y.o. female presents with c/o cat bite at estate sell earlier today. Stray cat approached pt while outside. Pt bent down to pet cat which resulted in cat bitting pt on R hand. Movax, augmentin, tetanus, and hyperRAB administered. XR ordered. Will reassess.   Sherri SearParsons, Melissa E, MD 10:58 PM 09/20/2015         ED Disposition   ED Disposition: Discharge      ED Clinical Impression   ED Clinical Impression:   Cat bite of hand, right, initial encounter  Cellulitis of right upper extremity      ED Patient Status   Patient Status:   Good        ED Medical Evaluation Initiated   Medical Evaluation Initiated:   Yes, filed at 09/20/15 2250  by Sherri SearParsons, Melissa E, MD        Scribe Attestation: I, Teodoro SprayElisabeth Adkins, have acted as a Neurosurgeonscribe for Sherri SearParsons, Melissa E, MD 10:58 PM 09/20/2015     Physician Attestation: I have reviewed and confirmed the information stated by the scribe and made corrections and edits as appropriate.  I have personally provided the services documented by the scribe.      Sherri SearMelissa E Parsons, MD           Sherri SearParsons, Melissa E, MD  09/24/15 [phone]

## 2016-06-10 ENCOUNTER — Encounter: Payer: Self-pay | Admitting: *Deleted

## 2016-06-10 ENCOUNTER — Emergency Department
Admission: EM | Admit: 2016-06-10 | Discharge: 2016-06-10 | Disposition: A | Discharge status: Home / Self Care | Payer: No Typology Code available for payment source | Attending: Student in an Organized Health Care Education/Training Program | Admitting: Student in an Organized Health Care Education/Training Program

## 2016-06-10 ENCOUNTER — Emergency Department: Payer: No Typology Code available for payment source

## 2016-06-10 DIAGNOSIS — Y92410 Unspecified street and highway as the place of occurrence of the external cause: Secondary | ICD-10-CM | POA: Diagnosis not present

## 2016-06-10 DIAGNOSIS — S0990XA Unspecified injury of head, initial encounter: Secondary | ICD-10-CM | POA: Diagnosis present

## 2016-06-10 DIAGNOSIS — Z87891 Personal history of nicotine dependence: Secondary | ICD-10-CM | POA: Diagnosis not present

## 2016-06-10 DIAGNOSIS — S8011XA Contusion of right lower leg, initial encounter: Secondary | ICD-10-CM | POA: Diagnosis not present

## 2016-06-10 DIAGNOSIS — I10 Essential (primary) hypertension: Secondary | ICD-10-CM | POA: Insufficient documentation

## 2016-06-10 DIAGNOSIS — S0003XA Contusion of scalp, initial encounter: Secondary | ICD-10-CM | POA: Diagnosis not present

## 2016-06-10 DIAGNOSIS — Y9389 Activity, other specified: Secondary | ICD-10-CM | POA: Insufficient documentation

## 2016-06-10 DIAGNOSIS — Y999 Unspecified external cause status: Secondary | ICD-10-CM | POA: Insufficient documentation

## 2016-06-10 HISTORY — DX: Disorder of thyroid, unspecified: E07.9

## 2016-06-10 HISTORY — DX: Essential (primary) hypertension: I10

## 2016-06-10 MED ORDER — TRAMADOL HCL 50 MG PO TABS: 50.0000 mg | ORAL_TABLET | Freq: Two times a day (BID) | ORAL | 0 refills | Status: AC

## 2016-06-10 MED ORDER — CYCLOBENZAPRINE HCL 10 MG PO TABS
5.0000 mg | ORAL_TABLET | Freq: Once | ORAL | Status: AC
  Administered 2016-06-10: 5 mg via ORAL
  Filled 2016-06-10: qty 1

## 2016-06-10 MED ORDER — IBUPROFEN 800 MG PO TABS
800.0000 mg | ORAL_TABLET | Freq: Once | ORAL | Status: AC
  Administered 2016-06-10: 800 mg via ORAL
  Filled 2016-06-10: qty 1

## 2016-06-10 MED ORDER — ONDANSETRON 4 MG PO TBDP: 4.0000 mg | ORAL_TABLET | Freq: Four times a day (QID) | ORAL | 0 refills | Status: AC | PRN

## 2016-06-10 MED ORDER — ONDANSETRON 4 MG PO TBDP
4.0000 mg | ORAL_TABLET | Freq: Once | ORAL | Status: AC
  Administered 2016-06-10: 4 mg via ORAL
  Filled 2016-06-10: qty 1

## 2016-06-10 MED ORDER — CYCLOBENZAPRINE HCL 5 MG PO TABS: 5.0000 mg | ORAL_TABLET | Freq: Three times a day (TID) | ORAL | 0 refills | Status: DC | PRN

## 2016-06-10 MED ORDER — TRAMADOL HCL 50 MG PO TABS
50.0000 mg | ORAL_TABLET | Freq: Once | ORAL | Status: AC
  Administered 2016-06-10: 50 mg via ORAL
  Filled 2016-06-10: qty 1
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
## 2017-06-30 ENCOUNTER — Other Ambulatory Visit: Payer: Self-pay | Admitting: Family Medicine

## 2017-06-30 DIAGNOSIS — Z1239 Encounter for other screening for malignant neoplasm of breast: Secondary | ICD-10-CM

## 2017-06-30 DIAGNOSIS — Z780 Asymptomatic menopausal state: Secondary | ICD-10-CM

## 2017-07-05 ENCOUNTER — Encounter: Payer: Self-pay | Admitting: *Deleted

## 2017-07-05 ENCOUNTER — Telehealth: Payer: Self-pay

## 2017-07-05 ENCOUNTER — Other Ambulatory Visit: Payer: Self-pay

## 2017-07-05 DIAGNOSIS — Z1211 Encounter for screening for malignant neoplasm of colon: Secondary | ICD-10-CM

## 2017-07-05 NOTE — Telephone Encounter (Signed)
Gastroenterology Pre-Procedure Review  Request Date: 07/12/17 Requesting Physician: Dr. Tawni Carnesahliani  PATIENT REVIEW QUESTIONS: The patient responded to the following health history questions as indicated:    1. Are you having any GI issues? no 2. Do you have a personal history of Polyps? yes 3. Do you have a family history of Colon Cancer or Polyps? no 4. Diabetes Mellitus? no 5. Joint replacements in the past 12 months?no 6. Major health problems in the past 3 months?no 7. Any artificial heart valves, MVP, or defibrillator?no    MEDICATIONS & ALLERGIES:    Patient reports the following regarding taking any anticoagulation/antiplatelet therapy:   Plavix, Coumadin, Eliquis, Xarelto, Lovenox, Pradaxa, Brilinta, or Effient? no Aspirin? no  Patient confirms/reports the following medications:  Current Outpatient Medications  Medication Sig Dispense Refill  . cyclobenzaprine (FLEXERIL) 5 MG tablet Take 1 tablet (5 mg total) by mouth 3 (three) times daily as needed for muscle spasms. 15 tablet 0  . ondansetron (ZOFRAN ODT) 4 MG disintegrating tablet Take 1 tablet (4 mg total) by mouth every 6 (six) hours as needed for nausea or vomiting. 15 tablet 0  . traMADol (ULTRAM) 50 MG tablet Take 1 tablet (50 mg total) by mouth 2 (two) times daily. 12 tablet 0   No current facility-administered medications for this visit.     Patient confirms/reports the following allergies:  Allergies  Allergen Reactions  . Shellfish-Derived Products   . Sulfa Antibiotics     No orders of the defined types were placed in this encounter.   AUTHORIZATION INFORMATION Primary Insurance: 1D#: Group #:  Secondary Insurance: 1D#: Group #:  SCHEDULE INFORMATION: Date: 04/23/19Time: Location:[facility]

## 2017-07-06 ENCOUNTER — Encounter: Payer: Self-pay | Admitting: Anesthesiology

## 2017-07-06 ENCOUNTER — Other Ambulatory Visit: Payer: Self-pay

## 2017-07-06 MED ORDER — NA SULFATE-K SULFATE-MG SULF 17.5-3.13-1.6 GM/177ML PO SOLN: 1.0000 | Freq: Once | ORAL | 0 refills | Status: AC

## 2017-07-11 ENCOUNTER — Telehealth: Payer: Self-pay | Admitting: Gastroenterology

## 2017-07-11 NOTE — Discharge Instructions (Signed)
General Anesthesia, Adult, Care After °These instructions provide you with information about caring for yourself after your procedure. Your health care provider may also give you more specific instructions. Your treatment has been planned according to current medical practices, but problems sometimes occur. Call your health care provider if you have any problems or questions after your procedure. °What can I expect after the procedure? °After the procedure, it is common to have: °· Vomiting. °· A sore throat. °· Mental slowness. ° °It is common to feel: °· Nauseous. °· Cold or shivery. °· Sleepy. °· Tired. °· Sore or achy, even in parts of your body where you did not have surgery. ° °Follow these instructions at home: °For at least 24 hours after the procedure: °· Do not: °? Participate in activities where you could fall or become injured. °? Drive. °? Use heavy machinery. °? Drink alcohol. °? Take sleeping pills or medicines that cause drowsiness. °? Make important decisions or sign legal documents. °? Take care of children on your own. °· Rest. °Eating and drinking °· If you vomit, drink water, juice, or soup when you can drink without vomiting. °· Drink enough fluid to keep your urine clear or pale yellow. °· Make sure you have little or no nausea before eating solid foods. °· Follow the diet recommended by your health care provider. °General instructions °· Have a responsible adult stay with you until you are awake and alert. °· Return to your normal activities as told by your health care provider. Ask your health care provider what activities are safe for you. °· Take over-the-counter and prescription medicines only as told by your health care provider. °· If you smoke, do not smoke without supervision. °· Keep all follow-up visits as told by your health care provider. This is important. °Contact a health care provider if: °· You continue to have nausea or vomiting at home, and medicines are not helpful. °· You  cannot drink fluids or start eating again. °· You cannot urinate after 8-12 hours. °· You develop a skin rash. °· You have fever. °· You have increasing redness at the site of your procedure. °Get help right away if: °· You have difficulty breathing. °· You have chest pain. °· You have unexpected bleeding. °· You feel that you are having a life-threatening or urgent problem. °This information is not intended to replace advice given to you by your health care provider. Make sure you discuss any questions you have with your health care provider. °Document Released: 06/14/2000 Document Revised: 08/11/2015 Document Reviewed: 02/20/2015 °Elsevier Interactive Patient Education © 2018 [employer] ° °

## 2017-07-11 NOTE — Telephone Encounter (Signed)
Pt left vm she states she is scheduled for procedure and needs to change the time for the [place], she would like a call

## 2017-07-12 ENCOUNTER — Ambulatory Visit: Payer: Medicare HMO | Admitting: Anesthesiology

## 2017-07-12 ENCOUNTER — Ambulatory Visit
Admission: RE | Admit: 2017-07-12 | Discharge: 2017-07-12 | Disposition: A | Discharge status: Home / Self Care | Payer: Medicare HMO | Source: Ambulatory Visit | Attending: Gastroenterology | Admitting: Gastroenterology

## 2017-07-12 ENCOUNTER — Encounter
Admission: RE | Disposition: A | Discharge status: Home / Self Care | Payer: Self-pay | Source: Ambulatory Visit | Attending: Gastroenterology

## 2017-07-12 DIAGNOSIS — K648 Other hemorrhoids: Secondary | ICD-10-CM | POA: Diagnosis not present

## 2017-07-12 DIAGNOSIS — E890 Postprocedural hypothyroidism: Secondary | ICD-10-CM | POA: Diagnosis not present

## 2017-07-12 DIAGNOSIS — Z79899 Other long term (current) drug therapy: Secondary | ICD-10-CM | POA: Diagnosis not present

## 2017-07-12 DIAGNOSIS — K5730 Diverticulosis of large intestine without perforation or abscess without bleeding: Secondary | ICD-10-CM | POA: Diagnosis not present

## 2017-07-12 DIAGNOSIS — I10 Essential (primary) hypertension: Secondary | ICD-10-CM | POA: Insufficient documentation

## 2017-07-12 DIAGNOSIS — Z87891 Personal history of nicotine dependence: Secondary | ICD-10-CM | POA: Diagnosis not present

## 2017-07-12 DIAGNOSIS — Z79890 Hormone replacement therapy (postmenopausal): Secondary | ICD-10-CM | POA: Diagnosis not present

## 2017-07-12 DIAGNOSIS — K644 Residual hemorrhoidal skin tags: Secondary | ICD-10-CM

## 2017-07-12 DIAGNOSIS — Z1211 Encounter for screening for malignant neoplasm of colon: Secondary | ICD-10-CM

## 2017-07-12 DIAGNOSIS — G43909 Migraine, unspecified, not intractable, without status migrainosus: Secondary | ICD-10-CM | POA: Insufficient documentation

## 2017-07-12 DIAGNOSIS — Z791 Long term (current) use of non-steroidal anti-inflammatories (NSAID): Secondary | ICD-10-CM | POA: Insufficient documentation

## 2017-07-12 HISTORY — DX: Migraine, unspecified, not intractable, without status migrainosus: G43.909

## 2017-07-12 HISTORY — DX: Unspecified hearing loss, unspecified ear: H91.90

## 2017-07-12 HISTORY — DX: Motion sickness, initial encounter: T75.3XXA

## 2017-07-12 HISTORY — PX: COLONOSCOPY WITH PROPOFOL: SHX5780

## 2017-07-12 HISTORY — DX: Unspecified osteoarthritis, unspecified site: M19.90

## 2017-07-12 LAB — HM COLONOSCOPY

## 2017-07-12 SURGERY — COLONOSCOPY WITH PROPOFOL
Anesthesia: General | Wound class: Contaminated

## 2017-07-12 MED ORDER — PROPOFOL 10 MG/ML IV BOLUS
INTRAVENOUS | Status: DC | PRN
  Administered 2017-07-12 (×3): 10 mg via INTRAVENOUS
  Administered 2017-07-12: 40 mg via INTRAVENOUS
  Administered 2017-07-12 (×3): 20 mg via INTRAVENOUS
  Administered 2017-07-12 (×2): 10 mg via INTRAVENOUS
  Administered 2017-07-12: 40 mg via INTRAVENOUS
  Administered 2017-07-12: 20 mg via INTRAVENOUS
  Administered 2017-07-12: 60 mg via INTRAVENOUS
  Administered 2017-07-12 (×2): 10 mg via INTRAVENOUS
  Administered 2017-07-12: 20 mg via INTRAVENOUS
  Administered 2017-07-12: 10 mg via INTRAVENOUS
  Administered 2017-07-12: 20 mg via INTRAVENOUS

## 2017-07-12 MED ORDER — STERILE WATER FOR IRRIGATION IR SOLN
Status: DC | PRN
  Administered 2017-07-12: 08:00:00

## 2017-07-12 MED ORDER — SODIUM CHLORIDE 0.9 % IV SOLN: INTRAVENOUS | Status: DC

## 2017-07-12 MED ORDER — LACTATED RINGERS IV SOLN
INTRAVENOUS | Status: DC
  Administered 2017-07-12: 07:00:00 via INTRAVENOUS

## 2017-07-12 MED ORDER — LIDOCAINE HCL (CARDIAC) PF 100 MG/5ML IV SOSY
PREFILLED_SYRINGE | INTRAVENOUS | Status: DC | PRN
  Administered 2017-07-12: 20 mg via INTRAVENOUS

## 2017-07-12 SURGICAL SUPPLY — 24 items

## 2017-07-12 NOTE — Anesthesia Postprocedure Evaluation (Signed)
Anesthesia Post Note  Patient: Rebekah Ortiz  Procedure(s) Performed: COLONOSCOPY WITH PROPOFOL (N/A )  Patient location during evaluation: PACU Anesthesia Type: General Level of consciousness: awake Pain management: pain level controlled Vital Signs Assessment: post-procedure vital signs reviewed and stable Respiratory status: respiratory function stable Cardiovascular status: stable Postop Assessment: no signs of nausea or vomiting Anesthetic complications: no    Jola BabinskiElsje Aleanna Menge

## 2017-07-12 NOTE — Op Note (Signed)
[facility] Gastroenterology Patient Name: Rebekah RomneySonja Millman Procedure Date: 07/12/2017 7:37 AM MRN: [id number] Account #: [id number] Date of Birth: 09/27/1945 Admit Type: Outpatient Age: 72 [facility] Gender: Female Note Status: Finalized Procedure:            Colonoscopy Indications:          Screening for colorectal malignant neoplasm Providers:            Shaquisha Wynn B. Maximino Greenlandahiliani MD, MD Referring MD:         Oswaldo DoneAbby D. Hessie DienerBender MD, MD (Referring MD) Medicines:            Monitored Anesthesia Care Complications:        No immediate complications. Procedure:            Pre-Anesthesia Assessment:                       - Prior to the procedure, a History and Physical was                        performed, and patient medications, allergies and                        sensitivities were reviewed. The patient's tolerance of                        previous anesthesia was reviewed.                       - The risks and benefits of the procedure and the                        sedation options and risks were discussed with the                        patient. All questions were answered and informed                        consent was obtained.                       - Patient identification and proposed procedure were                        verified prior to the procedure by the physician, the                        nurse, the anesthetist and the technician. The                        procedure was verified in the pre-procedure area in the                        procedure room in the endoscopy suite.                       - ASA Grade Assessment: II - A patient with mild                        systemic disease.                       -  After reviewing the risks and benefits, the patient                        was deemed in satisfactory condition to undergo the                        procedure.                       After obtaining informed consent, the colonoscope was                    passed under direct vision. Throughout the procedure,                        the patient's blood pressure, pulse, and oxygen                        saturations were monitored continuously. The Olympus                        190 Colonoscope [id number]) was introduced through the                        anus and advanced to the the cecum, identified by                        appendiceal orifice and ileocecal valve. The                        colonoscopy was performed with ease. The patient                        tolerated the procedure well. The quality of the bowel                        preparation was good. Findings:      The perianal exam findings include non-thrombosed external hemorrhoids.      Multiple diverticula were found in the sigmoid colon.      The exam was otherwise without abnormality.      The rectum, sigmoid colon, descending colon, transverse colon, ascending       colon and cecum appeared normal.      Non-bleeding internal hemorrhoids were found during retroflexion. Impression:           - Non-thrombosed external hemorrhoids found on perianal                        exam.                       - Diverticulosis in the sigmoid colon.                       - The examination was otherwise normal.                       - The rectum, sigmoid colon, descending colon,                        transverse colon, ascending colon and cecum are normal.                       -  Non-bleeding internal hemorrhoids.                       - No specimens collected. Recommendation:       - Primary care/referring physician to please obtain and                        review previous colonoscopy reports from out of state                        and forward them to [facility] office as well.                       - Discharge patient to home.                       - Resume previous diet.                       - Continue present medications.                       - Repeat colonoscopy  in 5 years for screening purposes.                        (Due to patient's reported history of polyps in the                        past)                       - Return to primary care physician as previously                        scheduled.                       - The findings and recommendations were discussed with                        the patient.                       - The findings and recommendations were discussed with                        the patient's family.                       - High fiber diet. Procedure Code(s):    --- Professional ---                       Z6109, Colorectal cancer screening; colonoscopy on                        individual not meeting criteria for high risk Diagnosis Code(s):    --- Professional ---                       Z12.11, Encounter for screening for malignant neoplasm                        of colon  K64.4, Residual hemorrhoidal skin tags                       K64.8, Other hemorrhoids                       K57.30, Diverticulosis of large intestine without                        perforation or abscess without bleeding CPT copyright 2017 American Medical Association. All rights reserved. The codes documented in this report are preliminary and upon coder review may  be revised to meet current compliance requirements.  Melodie Bouillon, MD Michel Bickers B. Maximino Greenland MD, MD 07/12/2017 8:48:17 AM This report has been signed electronically. Number of Addenda: 0 Note Initiated On: 07/12/2017 7:37 AM Scope Withdrawal Time: 0 hours 17 minutes 26 seconds  Total Procedure Duration: 0 hours 30 minutes 18 seconds       [facility]

## 2017-07-12 NOTE — Anesthesia Preprocedure Evaluation (Signed)
Anesthesia Evaluation  Patient identified by MRN, date of birth, ID band Patient awake    Reviewed: Allergy & Precautions, NPO status , Patient's Chart, lab work & pertinent test results  Airway Mallampati: II  TM Distance: >3 FB     Dental   Pulmonary neg recent URI, former smoker,    breath sounds clear to auscultation       Cardiovascular hypertension,  Rhythm:Regular Rate:Normal     Neuro/Psych  Headaches,    GI/Hepatic neg GERD  ,  Endo/Other  Hypothyroidism   Renal/GU      Musculoskeletal  (+) Arthritis ,   Abdominal   Peds  Hematology   Anesthesia Other Findings   Reproductive/Obstetrics                             Anesthesia Physical Anesthesia Plan  ASA: II  Anesthesia Plan: General   Post-op Pain Management:    Induction: Intravenous  PONV Risk Score and Plan: Propofol infusion  Airway Management Planned: Nasal Cannula  Additional Equipment:   Intra-op Plan:   Post-operative Plan:   Informed Consent: I have reviewed the patients History and Physical, chart, labs and discussed the procedure including the risks, benefits and alternatives for the proposed anesthesia with the patient or authorized representative who has indicated his/her understanding and acceptance.   Dental advisory given  Plan Discussed with: CRNA  Anesthesia Plan Comments:         Anesthesia Quick Evaluation

## 2017-07-12 NOTE — Transfer of Care (Signed)
Immediate Anesthesia Transfer of Care Note  Patient: Rebekah Ortiz  Procedure(s) Performed: COLONOSCOPY WITH PROPOFOL (N/A )  Patient Location: PACU  Anesthesia Type: General  Level of Consciousness: awake, alert  and patient cooperative  Airway and Oxygen Therapy: Patient Spontanous Breathing and Patient connected to supplemental oxygen  Post-op Assessment: Post-op Vital signs reviewed, Patient's Cardiovascular Status Stable, Respiratory Function Stable, Patent Airway and No signs of Nausea or vomiting  Post-op Vital Signs: Reviewed and stable  Complications: No apparent anesthesia complications

## 2017-07-12 NOTE — H&P (Addendum)
Rebekah Bouillon, MD [address], [address], [place], [place], [address] [address], [place], [place], [address] Phone: [phone]  Fax: [phone]  Primary Care Physician:  Emogene Morgan, MD   Pre-Procedure History & Physical: HPI:  Rebekah Ortiz is a 72 y.o. female is here for a colonoscopy. Pt. Report 3 colonoscopies in the past. Last one per patient was 3 yrs ago and no polyps were found. Reports not available. No family history of colon cancer.    Past Medical History:  Diagnosis Date  . Arthritis    knuckles  . HOH (hard of hearing)    wears hearing aids sometimes  . Hypertension   . Migraine headache    monthly  . Motion sickness    boats  . Thyroid disease     Past Surgical History:  Procedure Laterality Date  . ABDOMINAL HYSTERECTOMY    . APPENDECTOMY    . CHOLECYSTECTOMY    . THYROIDECTOMY, PARTIAL      Prior to Admission medications   Medication Sig Start Date End Date Taking? Authorizing Provider  COLLAGEN-VITAMIN C PO Take by mouth daily.   Yes [provider]  Ginkgo Biloba Extract 60 MG CAPS Take by mouth daily.   Yes [provider]  MAGNESIUM-POTASSIUM PO Take by mouth daily.   Yes [provider]  mirabegron ER (MYRBETRIQ) 25 MG TB24 tablet Take 25 mg by mouth daily.   Yes [provider]  Multiple Vitamins-Minerals (PRESERVISION AREDS 2 PO) Take by mouth 2 (two) times daily.   Yes [provider]  NON FORMULARY Place under the tongue. CBD oil   Yes [provider]  rizatriptan (MAXALT) 10 MG tablet Take 10 mg by mouth as needed for migraine. May repeat in 2 hours if needed   Yes [provider]  SUMAtriptan (IMITREX) 50 MG tablet Take 50 mg by mouth every 2 (two) hours as needed for migraine. May repeat in 2 hours if headache persists or recurs.   Yes [provider]  thiamine (VITAMIN B-1) 100 MG tablet Take 100 mg by mouth daily.   Yes [provider]  thyroid (ARMOUR) 60 MG tablet Take 60 mg by mouth daily before breakfast.   Yes [provider]  TURMERIC PO Take 1,000 mg by mouth 2 (two) times daily.   Yes [provider]  Vitamin D, Ergocalciferol, 2000 units CAPS Take by mouth daily.   Yes [provider]  meloxicam (MOBIC) 7.5 MG tablet Take 7.5 mg by mouth daily.    [provider]  ondansetron (ZOFRAN ODT) 4 MG disintegrating tablet Take 1 tablet (4 mg total) by mouth every 6 (six) hours as needed for nausea or vomiting. Patient not taking: Reported on 07/05/2017 06/10/16   Menshew, Charlesetta Ivory, PA-C  traMADol (ULTRAM) 50 MG tablet Take 1 tablet (50 mg total) by mouth 2 (two) times daily. Patient not taking: Reported on 07/05/2017 06/10/16   Menshew, Charlesetta Ivory, PA-C    Allergies as of 07/05/2017 - Review Complete 07/05/2017  Allergen Reaction Noted  . Opium Nausea And Vomiting 07/05/2017  . Shellfish-derived products Hives 06/10/2016  . Sulfa antibiotics Rash 06/10/2016    History reviewed. No pertinent family history.  Social History   Socioeconomic History  . Marital status: Married    Spouse name: Not on file  . Number of children: Not on file  . Years of education: Not on file  . Highest education level: Not on  file  Occupational History  . Not on file  Social Needs  . Financial resource strain: Not on file  . Food insecurity:    Worry: Not on file    Inability: Not on file  . Transportation needs:    Medical: Not on file    Non-medical: Not on file  Tobacco Use  . Smoking status: Former Smoker    Types: Cigarettes    Last attempt to quit: 1981    Years since quitting: 38.3  . Smokeless tobacco: Never Used  Substance and Sexual Activity  . Alcohol use: Yes    Alcohol/week: 4.2 oz    Types: 7 Glasses of wine per week  . Drug use: No  . Sexual activity: Not on file  Lifestyle  . Physical activity:    Days per week: Not on file    Minutes per session: Not on  file  . Stress: Not on file  Relationships  . Social connections:    Talks on phone: Not on file    Gets together: Not on file    Attends religious service: Not on file    Active member of club or organization: Not on file    Attends meetings of clubs or organizations: Not on file    Relationship status: Not on file  . Intimate partner violence:    Fear of current or ex partner: Not on file    Emotionally abused: Not on file    Physically abused: Not on file    Forced sexual activity: Not on file  Other Topics Concern  . Not on file  Social History Narrative  . Not on file    Review of Systems: See HPI, otherwise negative ROS  Physical Exam: BP 135/68   Pulse 68   Temp 97.9 F (36.6 C) (Temporal)   Resp 16   Ht 5\' 2"  (1.575 m)   Wt 133 lb (60.3 kg)   SpO2 99%   BMI 24.33 kg/m  General:   Alert,  pleasant and cooperative in NAD Head:  Normocephalic and atraumatic. Neck:  Supple; no masses or thyromegaly. Lungs:  Clear throughout to auscultation, normal respiratory effort.    Heart:  +S1, +S2, Regular rate and rhythm, No edema. Abdomen:  Soft, nontender and nondistended. Normal bowel sounds, without guarding, and without rebound.   Neurologic:  Alert and  oriented x4;  grossly normal neurologically.  Impression/Plan: [name] is here for a colonoscopy to be performed for average risk screening.  Risks, benefits, limitations, and alternatives regarding  colonoscopy have been reviewed with the patient.  Questions have been answered.  All parties agreeable.   Pasty SpillersVarnita B Quantae Martel, MD  07/12/2017, 7:51 AM

## 2017-07-12 NOTE — Anesthesia Procedure Notes (Signed)
Procedure Name: MAC Performed by: Lind Guest, CRNA Pre-anesthesia Checklist: Patient identified, Emergency Drugs available, Suction available, Patient being monitored and Timeout performed Patient Re-evaluated:Patient Re-evaluated prior to induction Oxygen Delivery Method: Nasal cannula

## 2017-07-13 ENCOUNTER — Encounter: Payer: Self-pay | Admitting: Gastroenterology

## 2017-07-21 NOTE — Telephone Encounter (Signed)
Pt had colonoscopy on 07/12/17. No call now necessary.

## 2017-07-28 ENCOUNTER — Ambulatory Visit
Admission: RE | Admit: 2017-07-28 | Discharge: 2017-07-28 | Disposition: A | Discharge status: Home / Self Care | Payer: Medicare HMO | Source: Ambulatory Visit | Attending: Family Medicine | Admitting: Family Medicine

## 2017-07-28 DIAGNOSIS — M85852 Other specified disorders of bone density and structure, left thigh: Secondary | ICD-10-CM | POA: Insufficient documentation

## 2017-07-28 DIAGNOSIS — Z1231 Encounter for screening mammogram for malignant neoplasm of breast: Secondary | ICD-10-CM | POA: Diagnosis not present

## 2017-07-28 DIAGNOSIS — Z1239 Encounter for other screening for malignant neoplasm of breast: Secondary | ICD-10-CM

## 2017-07-28 DIAGNOSIS — Z780 Asymptomatic menopausal state: Secondary | ICD-10-CM | POA: Insufficient documentation

## 2018-12-27 ENCOUNTER — Other Ambulatory Visit: Payer: Self-pay | Admitting: Family Medicine

## 2018-12-27 DIAGNOSIS — Z1231 Encounter for screening mammogram for malignant neoplasm of breast: Secondary | ICD-10-CM

## 2019-08-14 ENCOUNTER — Ambulatory Visit
Admission: RE | Admit: 2019-08-14 | Discharge: 2019-08-14 | Disposition: A | Discharge status: Home / Self Care | Payer: Medicare HMO | Source: Ambulatory Visit | Attending: Family Medicine | Admitting: Family Medicine

## 2019-08-14 DIAGNOSIS — Z1231 Encounter for screening mammogram for malignant neoplasm of breast: Secondary | ICD-10-CM | POA: Insufficient documentation

## 2021-07-16 ENCOUNTER — Other Ambulatory Visit: Payer: Self-pay | Admitting: Family Medicine

## 2021-07-16 DIAGNOSIS — Z1231 Encounter for screening mammogram for malignant neoplasm of breast: Secondary | ICD-10-CM

## 2021-08-31 ENCOUNTER — Ambulatory Visit
Admission: RE | Admit: 2021-08-31 | Discharge: 2021-08-31 | Disposition: A | Discharge status: Home / Self Care | Payer: Medicare HMO | Source: Ambulatory Visit | Attending: Family Medicine | Admitting: Family Medicine

## 2021-08-31 DIAGNOSIS — Z1231 Encounter for screening mammogram for malignant neoplasm of breast: Secondary | ICD-10-CM | POA: Diagnosis present

## 2022-11-30 ENCOUNTER — Other Ambulatory Visit: Payer: Self-pay | Admitting: Family Medicine

## 2022-11-30 DIAGNOSIS — Z1231 Encounter for screening mammogram for malignant neoplasm of breast: Secondary | ICD-10-CM

## 2022-12-06 ENCOUNTER — Ambulatory Visit: Payer: Medicare HMO | Attending: Family Medicine | Admitting: Physical Therapy

## 2022-12-06 DIAGNOSIS — M25551 Pain in right hip: Secondary | ICD-10-CM | POA: Insufficient documentation

## 2022-12-06 DIAGNOSIS — M6281 Muscle weakness (generalized): Secondary | ICD-10-CM | POA: Insufficient documentation

## 2022-12-06 DIAGNOSIS — M5459 Other low back pain: Secondary | ICD-10-CM | POA: Insufficient documentation

## 2022-12-06 DIAGNOSIS — M533 Sacrococcygeal disorders, not elsewhere classified: Secondary | ICD-10-CM | POA: Insufficient documentation

## 2022-12-06 NOTE — Therapy (Deleted)
OUTPATIENT PHYSICAL THERAPY THORACOLUMBAR EVALUATION   Patient Name: Rebekah Ortiz MRN: [id number] DOB:17-Jul-1945, 77 y.o., female Today's Date: 12/06/2022  END OF SESSION:   Past Medical History:  Diagnosis Date   Arthritis    knuckles   HOH (hard of hearing)    wears hearing aids sometimes   Hypertension    Migraine headache    monthly   Motion sickness    boats   Thyroid disease    Past Surgical History:  Procedure Laterality Date   ABDOMINAL HYSTERECTOMY     APPENDECTOMY     BREAST CYST ASPIRATION     CHOLECYSTECTOMY     COLONOSCOPY WITH PROPOFOL N/A 07/12/2017   Procedure: COLONOSCOPY WITH PROPOFOL;  Surgeon: Pasty Spillers, MD;  Location: [facility];  Service: Endoscopy;  Laterality: N/A;   THYROIDECTOMY, PARTIAL     Patient Active Problem List   Diagnosis Date Noted   Encounter for screening colonoscopy    External hemorrhoids    Internal hemorrhoids    Diverticulosis of large intestine without diverticulitis     PCP: Emogene Morgan, MD  REFERRING PROVIDER: Emogene Morgan, MD  REFERRING DIAG: ***  RATIONALE FOR EVALUATION AND TREATMENT: {HABREHAB:27488}  THERAPY DIAG: No diagnosis found.  ONSET DATE: ***  FOLLOW-UP APPT SCHEDULED WITH REFERRING PROVIDER: {yes/no:20286}    SUBJECTIVE:                                                                                                                                                                                         SUBJECTIVE STATEMENT:  ***  PERTINENT HISTORY: ***  PAIN:    Pain Intensity: Present: /10, Best: /10, Worst: /10 Pain location: *** Pain Quality: {PAIN DESCRIPTION:21022940}  Radiating: {yes/no:20286}  Numbness/Tingling: {yes/no:20286} Focal Weakness: {yes/no:20286} Aggravating factors: *** Relieving factors: *** 24-hour pain behavior: *** How long can you sit: How long can you stand: History of prior back injury, pain, surgery, or therapy:  {yes/no:20286} Dominant hand: {RIGHT/LEFT:20294} Imaging: {yes/no:20286}  Red flags: Negative for bowel/bladder changes, saddle paresthesia, personal history of cancer, h/o spinal tumors, h/o compression fx, h/o abdominal aneurysm, abdominal pain, chills/fever, night sweats, nausea, vomiting, unrelenting pain, first onset of insidious LBP <20 y/o  PRECAUTIONS: {Therapy precautions:24002}  WEIGHT BEARING RESTRICTIONS: {Yes ***/No:24003}  FALLS: Has patient fallen in last 6 months? {fallsyesno:27318}  Living Environment Lives with: {[facility] lives with:25569::"lives with their family"} Lives in: {Lives in:25570} Stairs: {opstairs:27293} Has following equipment at home: {Assistive devices:23999}  Prior level of function: {PLOF:24004}  Occupational demands:   Hobbies:   Patient Goals: ***   OBJECTIVE:  Patient Surveys  {rehab surveys:24030}  Cognition Patient is oriented  to person, place, and time.  Recent memory is intact.  Remote memory is intact.  Attention span and concentration are intact.  Expressive speech is intact.  Patient's fund of knowledge is within normal limits for educational level.    Gross Musculoskeletal Assessment Tremor: None Bulk: Normal Tone: Normal No visible step-off along spinal column, no signs of scoliosis  GAIT: Distance walked: *** Assistive device utilized: {Assistive devices:23999} Level of assistance: {Levels of assistance:24026} Comments: ***  Posture: Lumbar lordosis: WNL Iliac crest height: Equal bilaterally Lumbar lateral shift: Negative  AROM AROM (Normal range in degrees) AROM   Lumbar   Flexion (65)   Extension (30)   Right lateral flexion (25)   Left lateral flexion (25)   Right rotation (30)   Left rotation (30)       Hip Right Left  Flexion (125)    Extension (15)    Abduction (40)    Adduction     Internal Rotation (45)    External Rotation (45)        Knee    Flexion (135)    Extension (0)        Ankle     Dorsiflexion (20)    Plantarflexion (50)    Inversion (35)    Eversion (15)    (* = pain; Blank rows = not tested)  LE MMT: MMT (out of 5) Right  Left   Hip flexion    Hip extension    Hip abduction    Hip adduction    Hip internal rotation    Hip external rotation    Knee flexion    Knee extension    Ankle dorsiflexion    Ankle plantarflexion    Ankle inversion    Ankle eversion    (* = pain; Blank rows = not tested)  Sensation Grossly intact to light touch throughout bilateral LEs as determined by testing dermatomes L2-S2. Proprioception, stereognosis, and hot/cold testing deferred on this date.  Reflexes R/L Knee Jerk (L3/4): 2+/2+  Ankle Jerk (S1/2): 2+/2+   Muscle Length Hamstrings: R: {NEGATIVE/POSITIVE ZOX:09604} L: {NEGATIVE/POSITIVE VWU:98119} Ely (quadriceps): R: {NEGATIVE/POSITIVE JYN:82956} L: {NEGATIVE/POSITIVE OZH:08657} Thomas (hip flexors): R: {NEGATIVE/POSITIVE QIO:96295} L: {NEGATIVE/POSITIVE MWU:13244} Ober: R: {NEGATIVE/POSITIVE WNU:27253} L: {NEGATIVE/POSITIVE GUY:40347}  Palpation Location Right Left         Lumbar paraspinals    Quadratus Lumborum    Gluteus Maximus    Gluteus Medius    Deep hip external rotators    PSIS    Fortin's Area (SIJ)    Greater Trochanter    (Blank rows = not tested) Graded on 0-4 scale (0 = no pain, 1 = pain, 2 = pain with wincing/grimacing/flinching, 3 = pain with withdrawal, 4 = unwilling to allow palpation)  Passive Accessory Intervertebral Motion Pt denies reproduction of back pain with CPA L1-L5 and UPA bilaterally L1-L5. Generally, hypomobile throughout  Special Tests Lumbar Radiculopathy and Discogenic: Centralization and Peripheralization (SN 92, -LR 0.12): {NEGATIVE/POSITIVE FOR:19998} Slump (SN 83, -LR 0.32): R: {NEGATIVE/POSITIVE FOR:19998} L: {NEGATIVE/POSITIVE FOR:19998} SLR (SN 92, -LR 0.29): R: {NEGATIVE/POSITIVE QQV:95638} L:  {NEGATIVE/POSITIVE VFI:43329} Crossed SLR (SP 90): R:  {NEGATIVE/POSITIVE JJO:84166} L: {NEGATIVE/POSITIVE AYT:01601}  Facet Joint: Extension-Rotation (SN 100, -LR 0.0): R: {NEGATIVE/POSITIVE UXN:23557} L: {NEGATIVE/POSITIVE DUK:02542}  Lumbar Foraminal Stenosis: Lumbar quadrant (SN 70): R: {NEGATIVE/POSITIVE HCW:23762} L: {NEGATIVE/POSITIVE GBT:51761}  Hip: FABER (SN 81): R: {NEGATIVE/POSITIVE YWV:37106} L: {NEGATIVE/POSITIVE YIR:48546} FADIR (SN 94): R: {NEGATIVE/POSITIVE EVO:35009} L: {NEGATIVE/POSITIVE FGH:82993} Hip scour (SN 50): R: {NEGATIVE/POSITIVE ZJI:96789} L: {NEGATIVE/POSITIVE  NGE:95284}  SIJ:  Thigh Thrust (SN 88, -LR 0.18) : R: {NEGATIVE/POSITIVE XLK:44010} L: {NEGATIVE/POSITIVE UVO:53664}  Piriformis Syndrome: FAIR Test (SN 88, SP 83): R: {NEGATIVE/POSITIVE QIH:47425} L: {NEGATIVE/POSITIVE ZDG:38756}  Functional Tasks Lifting: Deep squat: Sit to stand: Forward Step-Down Test: R:  L:  Lateral Step-Down Test: R:  L:   Beighton scale  LEFT  RIGHT           1. Passive dorsiflexion and hyperextension of the fifth MCP joint beyond 90  0 0   2. Passive apposition of the thumb to the flexor aspect of the forearm  0  0   3. Passive hyperextension of the elbow beyond 10  0  0   4. Passive hyperextension of the knee beyond 10  0  0   5. Active forward flexion of the trunk with the knees fully extended so that the palms of the hands rest flat on the floor   0   TOTAL         0/ 9      TODAY'S TREATMENT: DATE: ***     PATIENT EDUCATION:  Education details: *** Person educated: {Person educated:25204} Education method: {Education Method:25205} Education comprehension: {Education Comprehension:25206}   HOME EXERCISE PROGRAM:     ASSESSMENT:  CLINICAL IMPRESSION: Patient is a *** y.o. *** who was seen today for physical therapy evaluation and treatment for ***.   OBJECTIVE IMPAIRMENTS: {opptimpairments:25111}.   ACTIVITY LIMITATIONS: {activitylimitations:27494}  PARTICIPATION LIMITATIONS:  {participationrestrictions:25113}  PERSONAL FACTORS: {Personal factors:25162} are also affecting patient's functional outcome.   REHAB POTENTIAL: {rehabpotential:25112}  CLINICAL DECISION MAKING: {clinical decision making:25114}  EVALUATION COMPLEXITY: {Evaluation complexity:25115}   GOALS: Goals reviewed with patient? {yes/no:20286}  SHORT TERM GOALS: Target date: {follow up:25551}  Pt will be independent with HEP in order to improve strength and decrease back pain to improve pain-free function at home and work. Baseline: *** Goal status: INITIAL   LONG TERM GOALS: Target date: {follow up:25551}  Pt will increase FOTO to at least *** to demonstrate significant improvement in function at home and work related to back pain  Baseline:  Goal status: INITIAL  2.  Pt will decrease worst back pain by at least 2 points on the NPRS in order to demonstrate clinically significant reduction in back pain. Baseline: *** Goal status: INITIAL  3.  Pt will decrease mODI score by at least 13 points in order demonstrate clinically significant reduction in back pain/disability.       Baseline: *** Goal status: INITIAL  4.  *** Baseline: *** Goal status: INITIAL   PLAN: PT FREQUENCY: 1-2x/week  PT DURATION: {rehab duration:25117}  PLANNED INTERVENTIONS: Therapeutic exercises, Therapeutic activity, Neuromuscular re-education, Balance training, Gait training, Patient/Family education, Self Care, Joint mobilization, Joint manipulation, Vestibular training, Canalith repositioning, Orthotic/Fit training, DME instructions, Dry Needling, Electrical stimulation, Spinal manipulation, Spinal mobilization, Cryotherapy, Moist heat, Taping, Traction, Ultrasound, Ionotophoresis 4mg /ml Dexamethasone, Manual therapy, and Re-evaluation.  PLAN FOR NEXT SESSION: ***   Consuela Mimes, PT, DPT [phone]  Gertie Exon, PT 12/06/2022, 7:58 AM

## 2022-12-08 ENCOUNTER — Ambulatory Visit: Payer: Medicare HMO | Admitting: Physical Therapy

## 2022-12-08 ENCOUNTER — Encounter: Payer: Self-pay | Admitting: Physical Therapy

## 2022-12-08 DIAGNOSIS — M5459 Other low back pain: Secondary | ICD-10-CM | POA: Diagnosis present

## 2022-12-08 DIAGNOSIS — M25551 Pain in right hip: Secondary | ICD-10-CM | POA: Diagnosis present

## 2022-12-08 DIAGNOSIS — M6281 Muscle weakness (generalized): Secondary | ICD-10-CM

## 2022-12-08 DIAGNOSIS — M533 Sacrococcygeal disorders, not elsewhere classified: Secondary | ICD-10-CM

## 2022-12-08 NOTE — Therapy (Unsigned)
OUTPATIENT PHYSICAL THERAPY THORACOLUMBAR/LOWER QUARTER EVALUATION   Patient Name: Rebekah Ortiz MRN: [id number] DOB:[birth year], 77 y.o., female Today's Date: 12/08/2022  END OF SESSION:  PT End of Session - 12/08/22 1056     Visit Number 1    Number of Visits 13    Date for PT Re-Evaluation 01/20/23    Authorization Type [employer]    PT Start Time 0819    PT Stop Time 0905    PT Time Calculation (min) 46 min    Activity Tolerance Patient tolerated treatment well;No increased pain    Behavior During Therapy WFL for tasks assessed/performed             Past Medical History:  Diagnosis Date   Arthritis    knuckles   HOH (hard of hearing)    wears hearing aids sometimes   Hypertension    Migraine headache    monthly   Motion sickness    boats   Thyroid disease    Past Surgical History:  Procedure Laterality Date   ABDOMINAL HYSTERECTOMY     APPENDECTOMY     BREAST CYST ASPIRATION     CHOLECYSTECTOMY     COLONOSCOPY WITH PROPOFOL N/A 07/12/2017   Procedure: COLONOSCOPY WITH PROPOFOL;  Surgeon: Pasty Spillers, MD;  Location: [facility];  Service: Endoscopy;  Laterality: N/A;   THYROIDECTOMY, PARTIAL     Patient Active Problem List   Diagnosis Date Noted   Other low back pain 12/06/2022   Encounter for screening colonoscopy    External hemorrhoids    Internal hemorrhoids    Diverticulosis of large intestine without diverticulitis     PCP: Emogene Morgan, MD  REFERRING PROVIDER: Emogene Morgan, MD  REFERRING DIAG: M54.50 (ICD-10-CM) - Low back pain, unspecified   RATIONALE FOR EVALUATION AND TREATMENT: Rehabilitation  THERAPY DIAG: Other low back pain  Sacral back pain  Muscle weakness (generalized)  Pain in right hip  ONSET DATE: 06/07/22 (worsening pain and persistent R PSIS pain over previous 6 months, resolution of L-sided pain with chiropractic)  FOLLOW-UP APPT SCHEDULED WITH REFERRING PROVIDER: None currently on  schedule   SUBJECTIVE:                                                                                                                                                                                         SUBJECTIVE STATEMENT:  77 year old female with primary complaint of low back pain, R>L-sided pain.   PERTINENT HISTORY: 77 year old female with primary complaint of low back pain, R>L-sided pain. Patient reports using chiropractor for many years. Patient reports pain along R PSIS. Patient reports she wanted to  get MRI, but her MD had to refer her to PT and to orthopedist first. Patient reports symptoms are continuous without notable change. Pt reports walking up to 1/2 mile at health center and tolerated this well. Pt manages all household cleaning, yard maintenance, and taking care of her husband. Patient reports no referred pain into LE. Pt reports some numbness/tingling intermittently along L thoracic region and sometimes in C-spine. Pt denies progressive LE weakness, but she does walk carefully due to fear of putting much pressure onto low back. No night pain. Pt reports hx of overactive bladder, but no recent changes in bathroom habits. Pt reports intermittently dragging her R foot.   PAIN:    Pain Intensity: Present: 3/10, Best: 3/10, Worst: 8/10 Pain location: R PSIS region/sacral region  Pain Quality:  pinching, intermittently sharp pain  ; constant pain  Radiating: No  Numbness/Tingling: No Focal Weakness: No Aggravating factors: prolonged sitting, leaning forward for sustained period, sleeping on R side, sit to stand after prolonged sitting Relieving factors: ice pack, chiropractic for some symptoms, lidocaine  24-hour pain behavior: None   History of prior back injury, pain, surgery, or therapy: Yes; Hx of long-term chiropractic care; no other Sx/treatment  Imaging: No imaging on lumbar spine   Red flags: Negative for bowel/bladder changes, saddle paresthesia, personal history  of cancer, h/o spinal tumors, h/o compression fx, h/o abdominal aneurysm, abdominal pain, chills/fever, night sweats, nausea, vomiting, unrelenting pain, first onset of insidious LBP <20 y/o  PRECAUTIONS: None  WEIGHT BEARING RESTRICTIONS: No  FALLS: Has patient fallen in last 6 months? No  Living Environment Lives with: lives with their spouse; pt cares for disabled husband. Daughter lives within 1/2 mile.  Lives in: House/apartment Has following equipment at home: adaptive equipment in husband's bathroom; pt does not need adaptive equipment   Prior level of function: Independent  Occupational demands: Retired  Presenter, broadcasting: Pt's time is occupied with taking care of disabled husband; pt likes painting and crocheting in craft room   Patient Goals: Get rid of pain    OBJECTIVE:  Patient Surveys  FOTO 70, predicted outcome score of 72  Cognition Patient is oriented to person, place, and time.  Recent memory is intact.  Remote memory is intact.  Attention span and concentration are intact.  Expressive speech is intact.  Patient's fund of knowledge is within normal limits for educational level.    Gross Musculoskeletal Assessment Tremor: None Bulk: Normal Tone: Normal No visible step-off along spinal column, no signs of scoliosis  GAIT: Distance walked: 30 ft Assistive device utilized: None Level of assistance: Complete Independence Comments: Good to heel to toe progression, more L pelvic drop with ipsilateral stance phase  Posture: Lumbar lordosis: WNL Good self-selected posture, intermittent kyphotic posturing with prolonged sitting Iliac crest height: Equal bilaterally Lumbar lateral shift: Negative  AROM AROM (Normal range in degrees) AROM   Lumbar   Flexion (65) 75%  Extension (30) 100%  Right lateral flexion (25) WFL* (pinch along R PSIS)  Left lateral flexion (25) WFL  Right rotation (30) WFL  Left rotation (30) WFL      Hip Right Left  Flexion (125) WNL    Extension (15)    Abduction (40) WNL   Adduction     Internal Rotation (45) WNL   External Rotation (45) 35       (* = pain; Blank rows = not tested)  LE MMT: MMT (out of 5) Right  Left   Hip flexion 4- 4-  Hip extension    Hip abduction    Hip adduction    Hip internal rotation    Hip external rotation    Knee flexion 5 5  Knee extension 5 5  Ankle dorsiflexion 4 4  Ankle plantarflexion    Ankle inversion    Ankle eversion    Great toe extension 4+ 4-  (* = pain; Blank rows = not tested)  Sensation Deferred  Reflexes Deferred  Muscle Length Hamstrings: R: Positive for mild motion loss, -20 deg knee EXT.  L: Positive for mild motion loss, -20 deg knee EXT [place] (quadriceps): R: Not examined L: Not examined Thomas (hip flexors): R: Not examined L: Not examined   Palpation Location Right Left         Lumbar paraspinals    Quadratus Lumborum    Gluteus Maximus    Gluteus Medius    Deep hip external rotators 1   PSIS 1   Fortin's Area (SIJ) 1 0  Greater Trochanter 1   (Blank rows = not tested) Graded on 0-4 scale (0 = no pain, 1 = pain, 2 = pain with wincing/grimacing/flinching, 3 = pain with withdrawal, 4 = unwilling to allow palpation)  Passive Accessory Intervertebral Motion Pt denies reproduction of back pain with CPA L1-L5. Generally, hypomobile throughout  Special Tests Lumbar Radiculopathy and Discogenic: Centralization and Peripheralization (SN 92, -LR 0.12): Not examined Slump (SN 83, -LR 0.32): R: Negative L: Negative SLR (SN 92, -LR 0.29): R: Negative L:  Negative   Facet Joint: Extension-Rotation (SN 100, -LR 0.0): R: Negative L: Negative  Lumbar Foraminal Stenosis: Lumbar quadrant (SN 70): R: Negative L: Negative  Hip: FABER (SN 81): R: Not examined L: Not examined  Hip scour (SN 50): R: Negative L: Negative  SIJ:  Thigh Thrust (SN 88, -LR 0.18) : R: Negative L: Not examined    TODAY'S TREATMENT: DATE: 12/08/22     Therapeutic  Exercise - for HEP establishment, discussion on appropriate exercise/activity modification, PT education  Patient education on current condition, exam ruling out radiculopathy or SI dysfunction, prognosis, plan of care. Discussion on activity modification to prevent flare-up of condition, including use of kneeling/squat to reach low-lying item versus trunk flexion, and modification to standing activity at kitchen sink.   Reviewed baseline home exercises and provided handout for [employer] program (see Access Code); tactile cueing and therapist demonstration utilized as needed for carryover of proper technique to HEP.      PATIENT EDUCATION:  Education details: see above for patient education details Person educated: Patient Education method: Explanation, Demonstration, and Handouts Education comprehension: verbalized understanding and returned demonstration   HOME EXERCISE PROGRAM:  Access Code: [email or web address] URL: [email or web address] Date: 12/08/2022 Prepared by: Consuela Mimes  Exercises - Supine Piriformis Stretch with Foot on Ground  - 2 x daily - 7 x weekly - 3 sets - 30sec hold - Supine Pelvic Tilt  - 2 x daily - 7 x weekly - 2 sets - 10 reps - Supine Bridge with Mini Swiss Ball Between Knees  - 2 x daily - 7 x weekly - 2 sets - 10 reps   ASSESSMENT:  CLINICAL IMPRESSION: Patient is a 77 y.o. female who is high-functioning relative to age who was seen today for physical therapy evaluation and treatment for chronic back pain with primary complaint of localized pain affecting R PSIS region.    OBJECTIVE IMPAIRMENTS: {opptimpairments:25111}.   ACTIVITY LIMITATIONS: {activitylimitations:27494}  PARTICIPATION LIMITATIONS: {participationrestrictions:25113}  PERSONAL FACTORS: {Personal factors:25162} are also  affecting patient's functional outcome.   REHAB POTENTIAL: {rehabpotential:25112}  CLINICAL DECISION MAKING: {clinical decision making:25114}  EVALUATION  COMPLEXITY: {Evaluation complexity:25115}   GOALS: Goals reviewed with patient? Yes  SHORT TERM GOALS: Target date: 12/29/2022  Pt will be independent with HEP in order to improve strength and decrease back pain to improve pain-free function at home and work. Baseline: 12/08/22: Baseline HEP initiated Goal status: INITIAL   LONG TERM GOALS: Target date: 01/20/2023  Pt will increase FOTO to at least 72 to demonstrate significant improvement in function at home and work related to back pain  Baseline: 12/08/22: 70 Goal status: INITIAL  2.  Pt will decrease worst back pain by at least 2 points on the NPRS in order to demonstrate clinically significant reduction in back pain. Baseline: 12/08/22: 8/10 at worst Goal status: INITIAL  3.  Pt will .       Baseline: *** Goal status: INITIAL  4.  *** Baseline: *** Goal status: INITIAL   PLAN: PT FREQUENCY: 1-2x/week  PT DURATION: 6 weeks  PLANNED INTERVENTIONS: Therapeutic exercise, Patient/Family education, Self Care, Joint mobilization, Dry Needling, Electrical stimulation, Spinal mobilization, Cryotherapy, Moist heat, Taping, Traction, Manual therapy, and Re-evaluation.  PLAN FOR NEXT SESSION: Manual therapy techniques for sacral mobilization, STM/IASTM, consideration of dry needling. Continue with gluteal and lumbopelvic strengthening, gluteal stretching.    Consuela Mimes, PT, DPT #[id number]  Gertie Exon, PT 12/08/2022, 10:57 AM

## 2022-12-14 ENCOUNTER — Ambulatory Visit
Admission: RE | Admit: 2022-12-14 | Discharge: 2022-12-14 | Disposition: A | Discharge status: Home / Self Care | Payer: Medicare HMO | Source: Ambulatory Visit | Attending: Family Medicine | Admitting: Family Medicine

## 2022-12-14 DIAGNOSIS — Z1231 Encounter for screening mammogram for malignant neoplasm of breast: Secondary | ICD-10-CM | POA: Insufficient documentation

## 2022-12-15 ENCOUNTER — Other Ambulatory Visit: Payer: Self-pay | Admitting: Orthopedic Surgery

## 2022-12-15 DIAGNOSIS — G8929 Other chronic pain: Secondary | ICD-10-CM

## 2022-12-15 DIAGNOSIS — M4807 Spinal stenosis, lumbosacral region: Secondary | ICD-10-CM

## 2022-12-22 ENCOUNTER — Encounter: Payer: Medicare HMO | Admitting: Physical Therapy

## 2022-12-23 ENCOUNTER — Encounter: Payer: Self-pay | Admitting: Orthopedic Surgery

## 2022-12-27 ENCOUNTER — Encounter: Payer: Medicare HMO | Admitting: Physical Therapy

## 2022-12-28 ENCOUNTER — Ambulatory Visit
Admission: RE | Admit: 2022-12-28 | Discharge: 2022-12-28 | Disposition: A | Discharge status: Home / Self Care | Payer: Medicare HMO | Source: Ambulatory Visit | Attending: Orthopedic Surgery | Admitting: Orthopedic Surgery

## 2022-12-28 DIAGNOSIS — M4807 Spinal stenosis, lumbosacral region: Secondary | ICD-10-CM

## 2022-12-28 DIAGNOSIS — M545 Low back pain, unspecified: Secondary | ICD-10-CM

## 2022-12-28 DIAGNOSIS — G8929 Other chronic pain: Secondary | ICD-10-CM

## 2022-12-29 ENCOUNTER — Encounter: Payer: Medicare HMO | Admitting: Physical Therapy

## 2023-01-03 ENCOUNTER — Encounter: Payer: Medicare HMO | Admitting: Physical Therapy

## 2023-01-05 ENCOUNTER — Encounter: Payer: Medicare HMO | Admitting: Physical Therapy

## 2023-01-10 ENCOUNTER — Encounter: Payer: Medicare HMO | Admitting: Physical Therapy

## 2023-01-12 ENCOUNTER — Encounter: Payer: Medicare HMO | Admitting: Physical Therapy

## 2023-01-17 ENCOUNTER — Encounter: Payer: Medicare HMO | Admitting: Physical Therapy

## 2023-01-19 ENCOUNTER — Encounter: Payer: Medicare HMO | Admitting: Physical Therapy

## 2023-01-24 ENCOUNTER — Encounter: Payer: Medicare HMO | Admitting: Physical Therapy

## 2023-01-26 ENCOUNTER — Encounter: Payer: Medicare HMO | Admitting: Physical Therapy

## 2023-01-31 ENCOUNTER — Encounter: Payer: Medicare HMO | Admitting: Physical Therapy

## 2023-02-02 ENCOUNTER — Encounter: Payer: Medicare HMO | Admitting: Physical Therapy

## 2023-10-11 IMAGING — MG MM DIGITAL SCREENING BILAT W/ TOMO AND CAD
8 series · 8 of 24 positions shown · non-contrast
Comparison: Previous exam(s).

CLINICAL DATA: Screening.

EXAM:
DIGITAL SCREENING BILATERAL MAMMOGRAM WITH TOMOSYNTHESIS AND CAD
TECHNIQUE: Bilateral screening digital craniocaudal and mediolateral oblique
mammograms were obtained. Bilateral screening digital breast
tomosynthesis was performed. The images were evaluated with
computer-aided detection.

[L CC synth-2D]
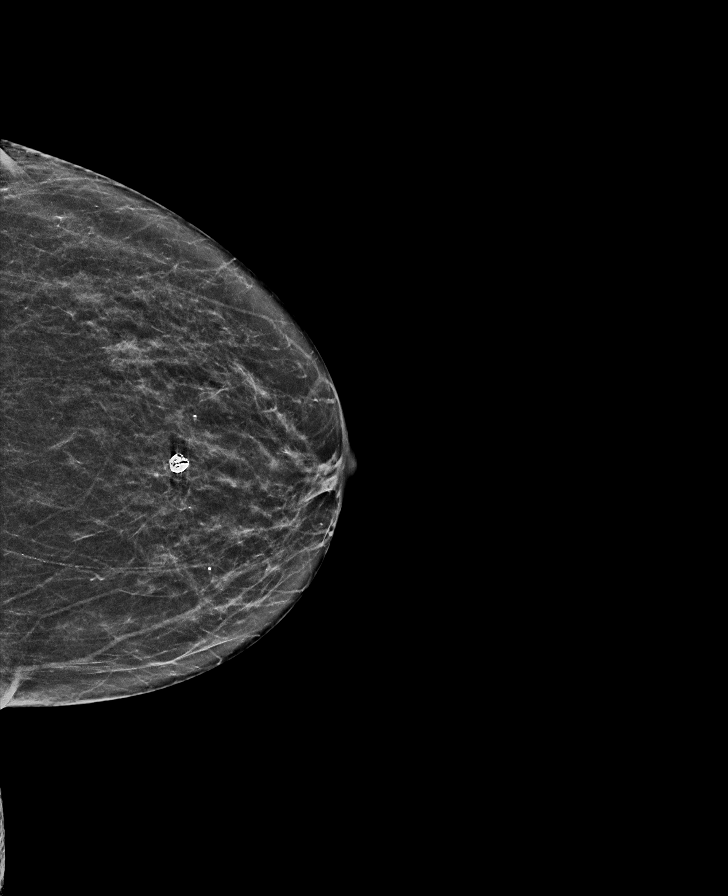

[R CC synth-2D]
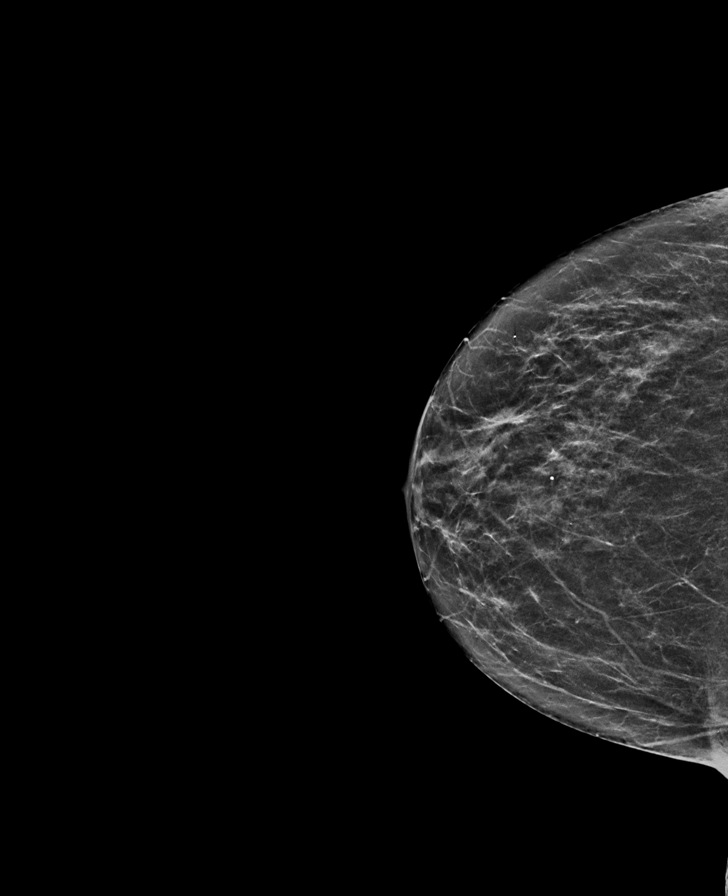

[L MLO synth-2D]
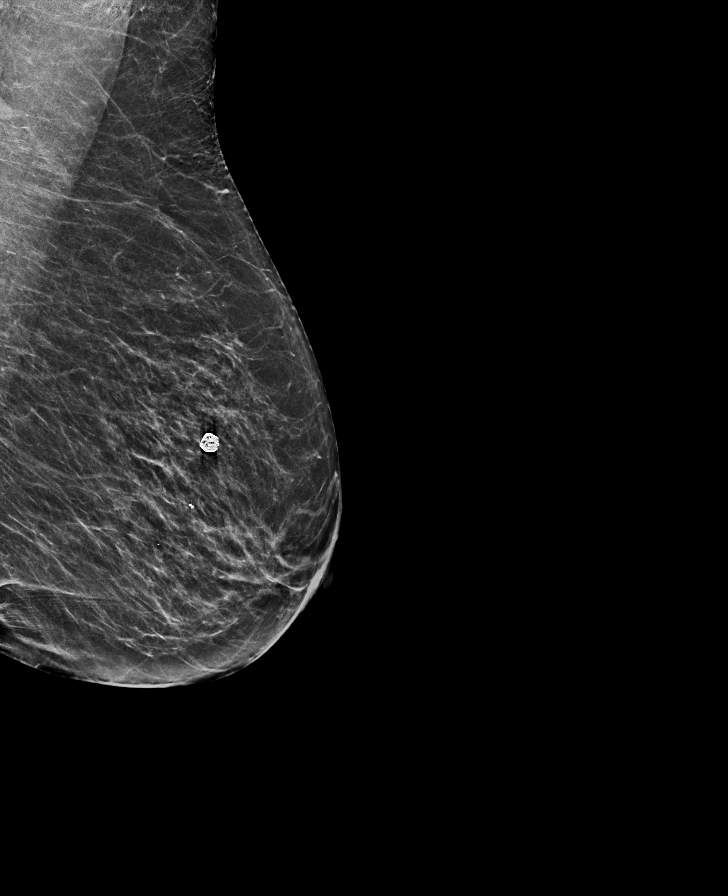

[R MLO synth-2D]
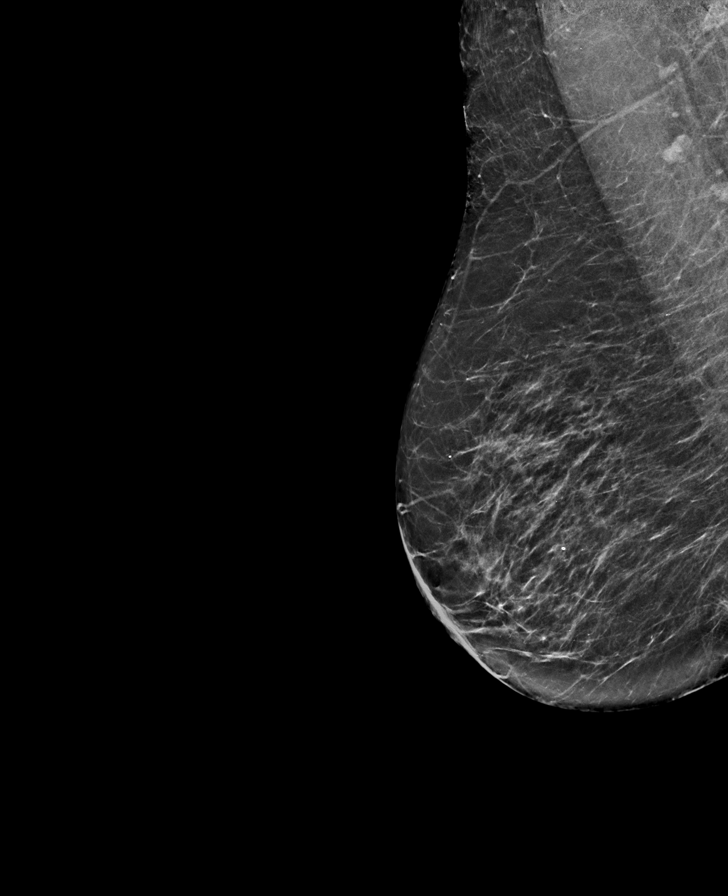

[L CC tomo · tomo slice 27/53.0]
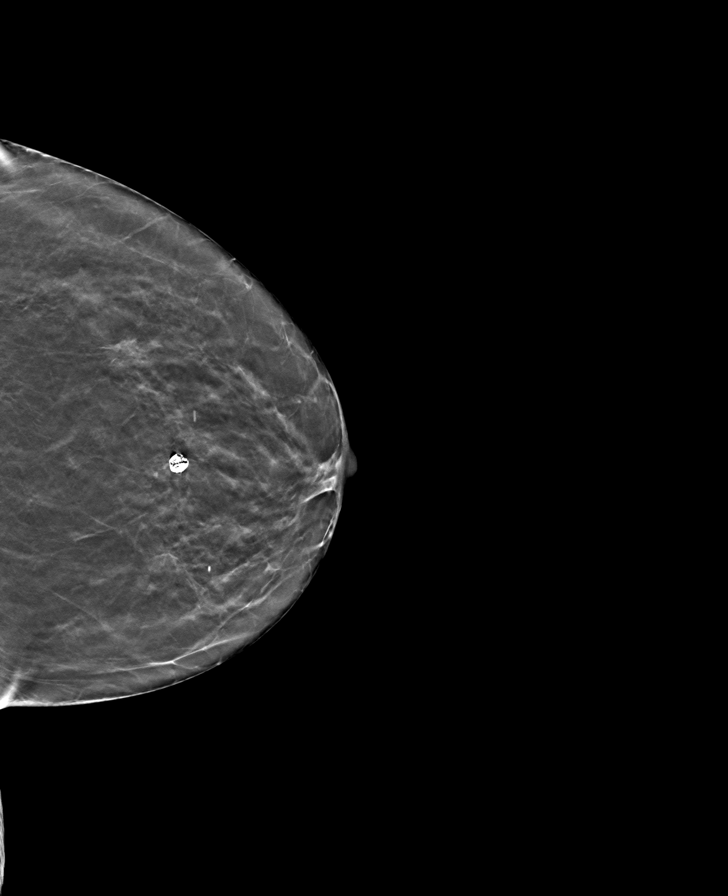

[R CC tomo · tomo slice 27/54.0]
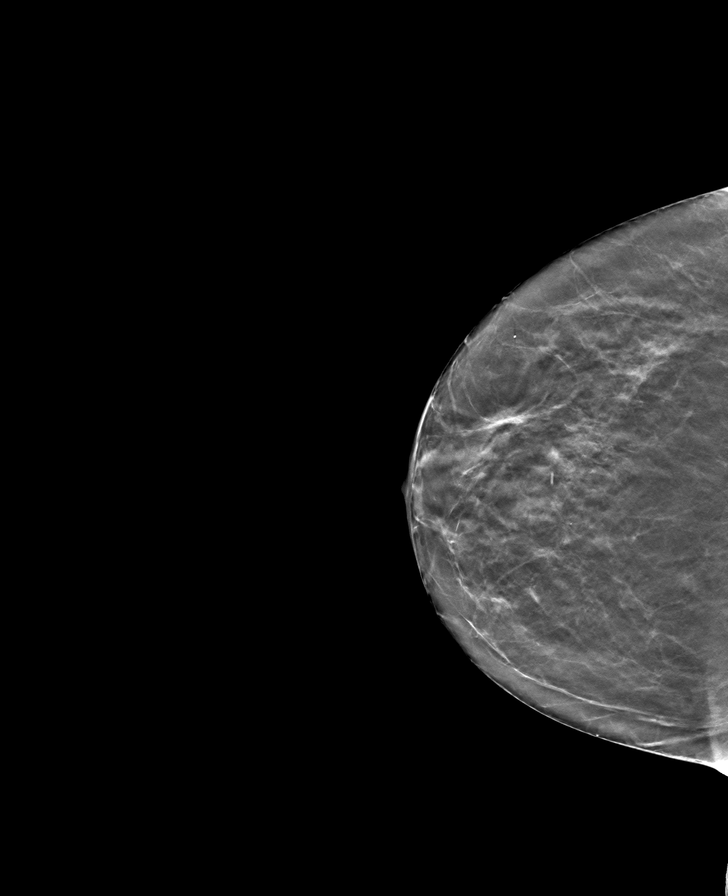

[L MLO tomo · tomo slice 30/59.0]
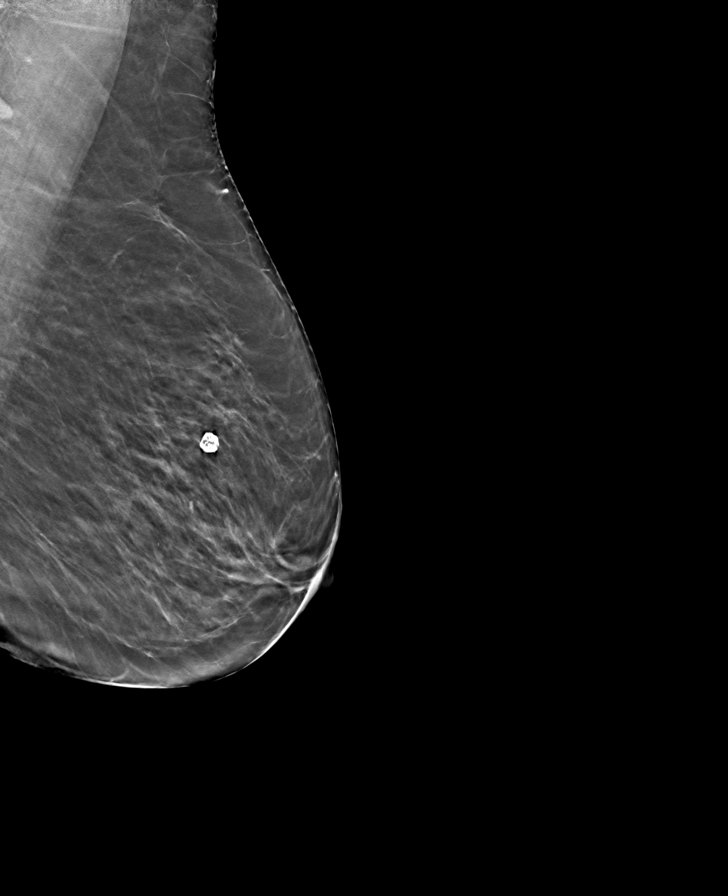

[R MLO tomo · tomo slice 31/61.0]
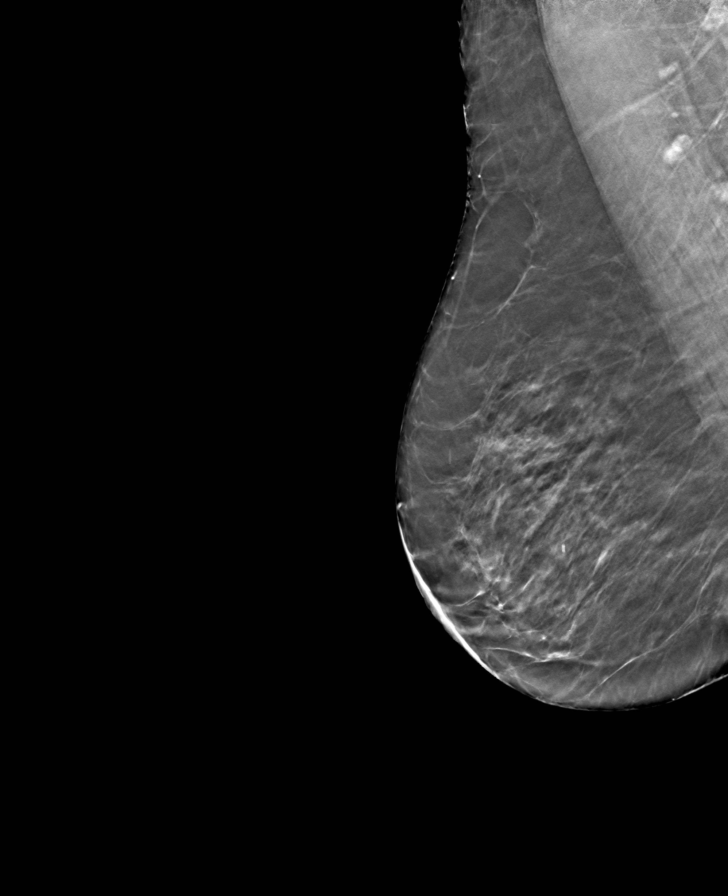

[8 of 24 positions shown; findings below may reference images not displayed]

ACR Breast Density Category b: There are scattered areas of
fibroglandular density.
FINDINGS: There are no findings suspicious for malignancy.
IMPRESSION: No mammographic evidence of malignancy. A result letter of this
screening mammogram will be mailed directly to the patient.

RECOMMENDATION:
Screening mammogram in one year. (Code:51-O-LD2)

BI-RADS CATEGORY  1: Negative.
